# Patient Record
Sex: Female | Born: 1950
Health system: Southern US, Community
[De-identification: ages and names within clinical notes are randomized; demographics above are authoritative.]

## PROBLEM LIST (undated history)

## (undated) DIAGNOSIS — B019 Varicella without complication: Secondary | ICD-10-CM

## (undated) DIAGNOSIS — Z87440 Personal history of urinary (tract) infections: Secondary | ICD-10-CM

## (undated) DIAGNOSIS — N393 Stress incontinence (female) (male): Secondary | ICD-10-CM

## (undated) DIAGNOSIS — T7840XA Allergy, unspecified, initial encounter: Secondary | ICD-10-CM

## (undated) HISTORY — DX: Personal history of urinary (tract) infections: Z87.440

## (undated) HISTORY — DX: Stress incontinence (female) (male): N39.3

## (undated) HISTORY — DX: Varicella without complication: B01.9

## (undated) HISTORY — PX: BREAST EXCISIONAL BIOPSY: SUR124

## (undated) HISTORY — DX: Allergy, unspecified, initial encounter: T78.40XA

---

## 1970-03-07 HISTORY — PX: TONSILLECTOMY AND ADENOIDECTOMY: SUR1326

## 1981-03-07 HISTORY — PX: TUBAL LIGATION: SHX77

## 2014-07-08 LAB — HM MAMMOGRAPHY: HM Mammogram: NORMAL

## 2014-11-25 ENCOUNTER — Encounter: Payer: Self-pay | Admitting: Primary Care

## 2014-11-25 ENCOUNTER — Ambulatory Visit (INDEPENDENT_AMBULATORY_CARE_PROVIDER_SITE_OTHER): Payer: 59 | Admitting: Primary Care

## 2014-11-25 ENCOUNTER — Encounter (INDEPENDENT_AMBULATORY_CARE_PROVIDER_SITE_OTHER): Payer: Self-pay

## 2014-11-25 VITALS — BP 130/70 | HR 63 | Temp 98.9°F | Ht 61.75 in | Wt 141.4 lb

## 2014-11-25 DIAGNOSIS — N393 Stress incontinence (female) (male): Secondary | ICD-10-CM | POA: Diagnosis not present

## 2014-11-25 NOTE — Patient Instructions (Signed)
Schedule a Welcome to [facility] in January at your convenience.   Start exercising. You need 1 hour of exercise 5 days weekly.  It was a pleasure to meet you today! Please don't hesitate to call me with any questions. Welcome to [facility]!

## 2014-11-25 NOTE — Progress Notes (Signed)
Subjective:    Patient ID: Savannah Macias, female    DOB: 1951/01/30, 64 y.o.   MRN: [id number]  HPI  Savannah Macias is a year female who presents today to establish care and discuss the problems mentioned below. Will obtain old records. Her last physical was in May 2016 with Pap and mammogram.   1) Overweight: She endorses a fair diet. She limites fried foods and incorporates vegetables and lean meats. She will have about 5 glasses of red and white wine weekly.  Diet consists of: Breakfast: Toast (jam) and coffee and juice or cereal with coffee and juice. Lunch: Sometimes skips, [place] sandwich with fruit Dinner: Lean meats, vegetables, cheese and crackers. Limited starches and steaks. Desserts: Infrequently Snacks: Occasional snack with pretzles, crackers, fruit. Beverages: Water, coffee, juice, crystal light. No sodas, sweet tea.  Exercise: She does not regularly exercise but is active around the house.  2) Frequent UTI's: Last UTI was 3 years ago. 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 She was once evaluated by a urologist years ago. Will intermittently experience stress incontinence with sneezing or coughing.   Review of Systems  Constitutional: Negative for unexpected weight change.  HENT: Negative for rhinorrhea.   Respiratory: Negative for cough and shortness of breath.   Cardiovascular: Negative for chest pain.  Gastrointestinal: Negative for diarrhea and constipation.  Genitourinary: Negative for difficulty urinating.  Musculoskeletal: Negative for myalgias and arthralgias.  Skin: Negative for rash.       Seeing a dermatologist in [place] annually  Allergic/Immunologic: Positive for environmental allergies.  Neurological: Negative for dizziness, numbness and headaches.  Psychiatric/Behavioral:       Family history of anxiety, no concerns for anxiety and depression.      Past Medical History  Diagnosis Date  . Chicken pox   . Allergy   . History of recurrent UTI (urinary tract infection)   . Stress incontinence     Social History   Social History  . Marital Status: Married    Spouse Name: N/A  . Number of Children: N/A  . Years of Education: N/A   Occupational History  . Not on file.   Social History Main Topics  . Smoking status: Never Smoker   . Smokeless tobacco: Not on file  . Alcohol Use: 0.0 oz/week    0 Standard drinks or equivalent per week     Comment: social  . Drug Use: Not on file  . Sexual Activity: Not on file   Other Topics Concern  . Not on file   Social History Narrative   Married.   Moved to [place] from [place], [place].   Retired Pharmacist, hospital. Taught kindergarten and second grade.   Enjoys working in the yard, reading, crocheting, sewing, cooking.    Past Surgical History  Procedure Laterality Date  . Tubal ligation  1983  . Tonsillectomy and adenoidectomy  1972    Family History  Problem Relation Age of Onset  . Lung cancer Mother   . Hyperlipidemia Father   . Hypertension Father   . Breast cancer Sister     2012  . Hypertension Brother   . Hypertension Paternal Grandmother   . Hypertension Paternal Grandfather     No Known Allergies  No current outpatient prescriptions on file prior to visit.   No current facility-administered medications on file prior to visit.    BP 130/70 mmHg  Pulse 63  Temp(Src) 98.9 F (37.2 C) (Oral)  Ht 5' 1.75" (1.568 m)  Wt 141 lb 6.4 oz (64.139 kg)  BMI 26.09 kg/m2  SpO2 94%    Objective:   Physical Exam  Constitutional: She is oriented to person, place, and time. She appears well-nourished.  Cardiovascular: Normal rate and regular rhythm.   Pulmonary/Chest: Effort normal and breath sounds normal.  Neurological: She is alert and oriented to person, place, and time.  Skin: Skin is warm and dry.  Psychiatric: She has a normal mood and affect.            Assessment & Plan:

## 2014-11-25 NOTE — Assessment & Plan Note (Signed)
Intermittent with sneezing and coughing. Wears panty liners occasionally. Symptoms currently not bothersome. Discussed treatment options in the future. Will continue to monitor.

## 2014-11-25 NOTE — Progress Notes (Signed)
Pre visit review using our clinic review tool, if applicable. No additional management support is needed unless otherwise documented below in the visit note. 

## 2014-11-28 ENCOUNTER — Encounter: Payer: Self-pay | Admitting: Primary Care

## 2015-02-24 ENCOUNTER — Encounter: Payer: Self-pay | Admitting: Primary Care

## 2015-02-24 ENCOUNTER — Ambulatory Visit (INDEPENDENT_AMBULATORY_CARE_PROVIDER_SITE_OTHER): Payer: Medicare Other | Admitting: Primary Care

## 2015-02-24 VITALS — BP 148/82 | HR 79 | Temp 98.3°F | Ht 61.75 in | Wt 142.0 lb

## 2015-02-24 DIAGNOSIS — N390 Urinary tract infection, site not specified: Secondary | ICD-10-CM | POA: Diagnosis not present

## 2015-02-24 DIAGNOSIS — R300 Dysuria: Secondary | ICD-10-CM

## 2015-02-24 DIAGNOSIS — R319 Hematuria, unspecified: Secondary | ICD-10-CM | POA: Diagnosis not present

## 2015-02-24 LAB — POCT URINALYSIS DIPSTICK
Bilirubin, UA: NEGATIVE
Glucose, UA: NEGATIVE
KETONES UA: NEGATIVE
NITRITE UA: NEGATIVE
PH UA: 6.5
PROTEIN UA: NEGATIVE
Spec Grav, UA: 1.01
Urobilinogen, UA: NEGATIVE

## 2015-02-24 MED ORDER — SULFAMETHOXAZOLE-TRIMETHOPRIM 800-160 MG PO TABS: 1.0000 | ORAL_TABLET | Freq: Two times a day (BID) | ORAL | Status: DC

## 2015-02-24 NOTE — Progress Notes (Signed)
   Subjective:    Patient ID: Savannah Macias, female    DOB: 12-Sep-1950, 64 y.o.   MRN: [id number]  HPI  Savannah Macias is a 64 year old female who presents today with a chief complaint of urinary frequency. She also reports occasional dysuria and pelvic pressure. Her symptoms have been present for the past 3-4 days. She's been increasing her intake of water and cranberry juice, and taking ibuprofen. Denies fevers, vaginal symptoms, abdominal pain.   Review of Systems  Constitutional: Negative for fever and chills.  Gastrointestinal: Negative for abdominal pain.  Genitourinary: Positive for dysuria and frequency. Negative for urgency, flank pain and vaginal discharge.       Past Medical History  Diagnosis Date  . Chicken pox   . Allergy   . History of recurrent UTI (urinary tract infection)   . Stress incontinence     Social History   Social History  . Marital Status: Married    Spouse Name: N/A  . Number of Children: N/A  . Years of Education: N/A   Occupational History  . Not on file.   Social History Main Topics  . Smoking status: Never Smoker   . Smokeless tobacco: Not on file  . Alcohol Use: 0.0 oz/week    0 Standard drinks or equivalent per week     Comment: social  . Drug Use: Not on file  . Sexual Activity: Not on file   Other Topics Concern  . Not on file   Social History Narrative   Married.   Moved to [place] from [place], [place].   Retired Pharmacist, hospital. Taught kindergarten and second grade.   Enjoys working in the yard, reading, crocheting, sewing, cooking.    Past Surgical History  Procedure Laterality Date  . Tubal ligation  1983  . Tonsillectomy and adenoidectomy  1972    Family History  Problem Relation Age of Onset  . Lung cancer Mother   . Hyperlipidemia Father   . Hypertension Father   . Breast cancer Sister     2012  . Hypertension Brother   . Hypertension Paternal Grandmother   . Hypertension Paternal Grandfather     No Known  Allergies  No current outpatient prescriptions on file prior to visit.   No current facility-administered medications on file prior to visit.    BP 148/82 mmHg  Pulse 79  Temp(Src) 98.3 F (36.8 C) (Oral)  Ht 5' 1.75" (1.568 m)  Wt 142 lb (64.411 kg)  BMI 26.20 kg/m2  SpO2 97%    Objective:   Physical Exam  Constitutional: She appears well-nourished.  Cardiovascular: Normal rate and regular rhythm.   Pulmonary/Chest: Effort normal and breath sounds normal.  Abdominal: There is no tenderness. There is no CVA tenderness.  Skin: Skin is warm and dry.          Assessment & Plan:  Urinary Tract Infection:  Urinary frequency, pelvic pressure, dysuria x 3 days. Increased water and cranberry juice consumption. Overall symptoms are worse. UA: Positive for leuks and blood. Negative for nitrites. Culture sent. RX for bactrim DS course sent to pharmacy. Push fluids, rest. Return precautions provided.

## 2015-02-24 NOTE — Patient Instructions (Signed)
Start Bactrim DS antibiotics for urinary tract infection. Take 1 tablet by mouth twice daily for 5 days.  Continue to drink plenty of water daily.  Please call me if no improvement in symptoms in 3 days.  It was a pleasure to see you today!  Urinary Tract Infection Urinary tract infections (UTIs) can develop anywhere along your urinary tract. Your urinary tract is your body's drainage system for removing wastes and extra water. Your urinary tract includes two kidneys, two ureters, a bladder, and a urethra. Your kidneys are a pair of bean-shaped organs. Each kidney is about the size of your fist. They are located below your ribs, one on each side of your spine. CAUSES Infections are caused by microbes, which are microscopic organisms, including fungi, viruses, and bacteria. These organisms are so small that they can only be seen through a microscope. Bacteria are the microbes that most commonly cause UTIs. SYMPTOMS  Symptoms of UTIs may vary by age and gender of the patient and by the location of the infection. Symptoms in young women typically include a frequent and intense urge to urinate and a painful, burning feeling in the bladder or urethra during urination. Older women and men are more likely to be tired, shaky, and weak and have muscle aches and abdominal pain. A fever may mean the infection is in your kidneys. Other symptoms of a kidney infection include pain in your back or sides below the ribs, nausea, and vomiting. DIAGNOSIS To diagnose a UTI, your caregiver will ask you about your symptoms. Your caregiver will also ask you to provide a urine sample. The urine sample will be tested for bacteria and white blood cells. White blood cells are made by your body to help fight infection. TREATMENT  Typically, UTIs can be treated with medication. Because most UTIs are caused by a bacterial infection, they usually can be treated with the use of antibiotics. The choice of antibiotic and length of  treatment depend on your symptoms and the type of bacteria causing your infection. HOME CARE INSTRUCTIONS  If you were prescribed antibiotics, take them exactly as your caregiver instructs you. Finish the medication even if you feel better after you have only taken some of the medication.  Drink enough water and fluids to keep your urine clear or pale yellow.  Avoid caffeine, tea, and carbonated beverages. They tend to irritate your bladder.  Empty your bladder often. Avoid holding urine for long periods of time.  Empty your bladder before and after sexual intercourse.  After a bowel movement, women should cleanse from front to back. Use each tissue only once. SEEK MEDICAL CARE IF:   You have back pain.  You develop a fever.  Your symptoms do not begin to resolve within 3 days. SEEK IMMEDIATE MEDICAL CARE IF:   You have severe back pain or lower abdominal pain.  You develop chills.  You have nausea or vomiting.  You have continued burning or discomfort with urination. MAKE SURE YOU:   Understand these instructions.  Will watch your condition.  Will get help right away if you are not doing well or get worse.   This information is not intended to replace advice given to you by your health care provider. Make sure you discuss any questions you have with your health care provider.   Document Released: 12/01/2004 Document Revised: 11/12/2014 Document Reviewed: 04/01/2011 Elsevier Interactive Patient Education [employer].

## 2015-02-24 NOTE — Progress Notes (Signed)
Pre visit review using our clinic review tool, if applicable. No additional management support is needed unless otherwise documented below in the visit note. 

## 2015-02-26 LAB — URINE CULTURE

## 2015-03-30 ENCOUNTER — Ambulatory Visit (INDEPENDENT_AMBULATORY_CARE_PROVIDER_SITE_OTHER): Payer: Medicare Other | Admitting: Primary Care

## 2015-03-30 ENCOUNTER — Other Ambulatory Visit: Payer: Self-pay | Admitting: Primary Care

## 2015-03-30 ENCOUNTER — Encounter: Payer: Self-pay | Admitting: Primary Care

## 2015-03-30 VITALS — BP 124/84 | HR 83 | Temp 98.3°F | Ht 60.0 in | Wt 137.8 lb

## 2015-03-30 DIAGNOSIS — Z23 Encounter for immunization: Secondary | ICD-10-CM | POA: Diagnosis not present

## 2015-03-30 DIAGNOSIS — Z0000 Encounter for general adult medical examination without abnormal findings: Secondary | ICD-10-CM

## 2015-03-30 DIAGNOSIS — Z13220 Encounter for screening for lipoid disorders: Secondary | ICD-10-CM

## 2015-03-30 DIAGNOSIS — Z131 Encounter for screening for diabetes mellitus: Secondary | ICD-10-CM

## 2015-03-30 DIAGNOSIS — Z1159 Encounter for screening for other viral diseases: Secondary | ICD-10-CM | POA: Diagnosis not present

## 2015-03-30 LAB — COMPREHENSIVE METABOLIC PANEL
ALBUMIN: 4.4 g/dL (ref 3.5–5.2)
ALK PHOS: 57 U/L (ref 39–117)
ALT: 14 U/L (ref 0–35)
AST: 20 U/L (ref 0–37)
BUN: 13 mg/dL (ref 6–23)
CHLORIDE: 108 meq/L (ref 96–112)
CO2: 26 mEq/L (ref 19–32)
Calcium: 9.1 mg/dL (ref 8.4–10.5)
Creatinine, Ser: 0.71 mg/dL (ref 0.40–1.20)
GFR: 87.78 mL/min (ref >60.00)
Glucose, Bld: 94 mg/dL (ref 70–99)
POTASSIUM: 4.1 meq/L (ref 3.5–5.1)
Sodium: 140 mEq/L (ref 135–145)
TOTAL PROTEIN: 7.2 g/dL (ref 6.0–8.3)
Total Bilirubin: 0.6 mg/dL (ref 0.2–1.2)

## 2015-03-30 MED ORDER — ZOSTER VACCINE LIVE 19400 UNT/0.65ML ~~LOC~~ SOLR: 0.6500 mL | Freq: Once | SUBCUTANEOUS | Status: DC

## 2015-03-30 MED ORDER — ZOSTER VACCINE LIVE 19400 UNT/0.65ML ~~LOC~~ SOLR
0.6500 mL | Freq: Once | SUBCUTANEOUS | Status: DC
Start: 2015-03-30 — End: 2015-03-30

## 2015-03-30 NOTE — Progress Notes (Signed)
Pre visit review using our clinic review tool, if applicable. No additional management support is needed unless otherwise documented below in the visit note. 

## 2015-03-30 NOTE — Addendum Note (Signed)
Addended by: Jacqualin Combes on: 03/30/2015 11:44 AM   Modules accepted: Orders

## 2015-03-30 NOTE — Assessment & Plan Note (Signed)
Prevnar 13 and Td provided today. RX printed for Zostavax vaccination to be obtained in 1 month. Pneumovax due in 1 year. Pap, mammogram, bone density scan, colonoscopy up to date. Discussed the importance of a healthy diet and regular exercise in order for weight loss and to reduce risk of other medical diseases. She is to start exercising with a neighbor. CMP and Hep C pending. Not due for lipids.  I have personally reviewed and have noted: 1. The patient's medical and social history 2. Their use of alcohol, tobacco or illicit drugs 3. Their current medications and supplements 4. The patient's functional ability including ADL's, fall risks,  home safety risks and hearing or visual impairment. 5. Diet and physical activities 6. Evidence for depression or mood disorder  Follow up in 1 year for repeat MWV.

## 2015-03-30 NOTE — Progress Notes (Signed)
Patient ID: Savannah Macias, female   DOB: 17-May-1950, 65 y.o.   MRN: [id number]  HPI: Savannah Macias is a 65 year old female who presents today for her Welcome to Medicare Visit.  Past Medical History  Diagnosis Date  . Chicken pox   . Allergy   . History of recurrent UTI (urinary tract infection)   . Stress incontinence     Current Outpatient Prescriptions  Medication Sig Dispense Refill  . sulfamethoxazole-trimethoprim (BACTRIM DS,SEPTRA DS) 800-160 MG tablet Take 1 tablet by mouth 2 (two) times daily. 10 tablet 0   No current facility-administered medications for this visit.    No Known Allergies  Family History  Problem Relation Age of Onset  . Lung cancer Mother   . Hyperlipidemia Father   . Hypertension Father   . Breast cancer Sister     2012  . Hypertension Brother   . Hypertension Paternal Grandmother   . Hypertension Paternal Grandfather     Social History   Social History  . Marital Status: Married    Spouse Name: N/A  . Number of Children: N/A  . Years of Education: N/A   Occupational History  . Not on file.   Social History Main Topics  . Smoking status: Never Smoker   . Smokeless tobacco: Not on file  . Alcohol Use: 0.0 oz/week    0 Standard drinks or equivalent per week     Comment: social  . Drug Use: Not on file  . Sexual Activity: Not on file   Other Topics Concern  . Not on file   Social History Narrative   Married.   Moved to [place] from [place], [place].   Retired Pharmacist, hospital. Taught kindergarten and second grade.   Enjoys working in the yard, reading, crocheting, sewing, cooking.    Hospitiliaztions: None  Health Maintenance:     Flu: Has not completed since 2009.   Tetanus: Marena Chancy, believes in 2005.  Pneumovax: Never completed  Prevnar: Due today.  Zostavax: Has not completed, RX printed and will obtain in 1 month.  Bone Density: Completed in May 2016  Colonoscopy: Completed in 2009   Eye Doctor: April 2016  Dental Exam: Completed in  September 2016  Mammogram: Completed in May 2016, due 2018  Pap: Completed in May 2016, due 2019      Providers: Alma Friendly, PCP, Dr. Tessie Fass Dermatology, Dr. Gwendel Hanson, Optometrist, Dr. Maudie Mercury, Dentist   I have personally reviewed and have noted: 1. The patient's medical and social history 2. Their use of alcohol, tobacco or illicit drugs 3. Their current medications and supplements 4. The patient's functional ability including ADL's, fall risks, home safety risks  and hearing or visual impairment. 5. Diet and physical activities 6. Evidence for depression or mood disorder  Subjective:   Review of Systems:   Constitutional: Denies fever, malaise, fatigue, headache or abrupt weight changes.  HEENT: Denies eye pain, eye redness, ear pain, ringing in the ears, wax buildup, runny nose, nasal congestion, bloody nose, or sore throat. Respiratory: Denies difficulty breathing, shortness of breath, cough or sputum production.   Cardiovascular: Denies chest pain, chest tightness, palpitations or swelling in the hands or feet.  Gastrointestinal: Denies abdominal pain, bloating, constipation, diarrhea or blood in the stool.  GU: Denies urgency, frequency, pain with urination, burning sensation, blood in urine, odor or discharge. She does notice stress incontinence occasionally. She completes [place] exercises.  Musculoskeletal: Denies decrease in range of motion, difficulty with gait, muscle pain or joint pain and  swelling.  Skin: Denies redness, rashes, lesions or ulcercations.  Neurological: Denies dizziness, difficulty with memory, difficulty with speech or problems with balance and coordination.   No other specific complaints in a complete review of systems (except as listed in HPI above).  Objective:  PE:   There were no vitals taken for this visit. Wt Readings from Last 3 Encounters:  02/24/15 142 lb (64.411 kg)  11/25/14 141 lb 6.4 oz (64.139 kg)    General: Appears their stated age,  well developed, well nourished in NAD. Skin: Warm, dry and intact. No rashes, lesions or ulcerations noted. HEENT: Head: normal shape and size; Eyes: sclera white, no icterus, conjunctiva pink, PERRLA and EOMs intact; Ears: Tm's gray and intact, normal light reflex; Nose: mucosa pink and moist, septum midline; Throat/Mouth: Teeth present, mucosa pink and moist, no exudate, lesions or ulcerations noted.  Neck: Normal range of motion. Neck supple, trachea midline. No massses, lumps or thyromegaly present.  Cardiovascular: Normal rate and rhythm. S1,S2 noted.  No murmur, rubs or gallops noted. No JVD or BLE edema. No carotid bruits noted. Pulmonary/Chest: Normal effort and positive vesicular breath sounds. No respiratory distress. No wheezes, rales or ronchi noted.  Abdomen: Soft and nontender. Normal bowel sounds, no bruits noted. No distention or masses noted. Liver, spleen and kidneys non palpable. Musculoskeletal: Normal range of motion. No signs of joint swelling. No difficulty with gait.  Neurological: Alert and oriented. Cranial nerves II-XII intact. Coordination normal. +DTRs bilaterally. Psychiatric: Mood and affect normal. Behavior is normal. Judgment and thought content normal.   EKG: NSR, rate at 67. T-wave inversion noted at V1 and V2, otherwise no ST-elevation, depression, PAC, PVC's. Asymptomatic.  BMET No results found for: NA, K, CL, CO2, GLUCOSE, BUN, CREATININE, CALCIUM, GFRNONAA, GFRAA  Lipid Panel  No results found for: CHOL, TRIG, HDL, CHOLHDL, VLDL, LDLCALC  CBC No results found for: WBC, RBC, HGB, HCT, PLT, MCV, MCH, MCHC, RDW, LYMPHSABS, MONOABS, EOSABS, BASOSABS  Hgb A1C No results found for: HGBA1C    Assessment and Plan:   Medicare Annual Wellness Visit:  Diet: Endorses a heart healthy diet  Breakfast: Toast, juice, coffee, hot water with honey, fruit, bagel, english muffin, cereal. Eggs and bacon on the weekends. Lunch: Sandwich, left overs, soup Dinner:  Meat (chicken, fish, beef), vegetable, pizza, pasta, rice Desserts: Occasionally Snack: Cheese and crackers, potato chips, fruit Beverages: Water, coffee, hot water, juice, wine Physical activity: Active lifestyle, not currently exercising.  Depression/mood screen: Negative Hearing: Intact to whispered voice Visual acuity: Grossly normal, performs annual eye exam  ADLs: Capable Fall risk: None Home safety: Good Cognitive evaluation: Intact to orientation, naming, recall and repetition EOL planning: Does have healthcare power of attorney. Husband to make decisions if unable. Full code.  Preventative Medicine: Prevnar 13 and Td provided today. RX printed for Zostavax vaccination to be obtained in 1 month. Pneumovax due in 1 year. Pap, mammogram, bone density scan, colonoscopy up to date. Discussed the importance of a healthy diet and regular exercise in order for weight loss and to reduce risk of other medical diseases. She is to start exercising with a neighbor. CMP and Hep C pending. Not due for lipids.   Next appointment: Follow up in 1 year for repeat MWV.

## 2015-03-30 NOTE — Patient Instructions (Signed)
Complete lab work prior to leaving today. I will notify you of your results once received.   You were provided with a script for the shingles vaccination. Please wait to obtain until 30 days after your pneumonia vaccination.  You were provided with a tetanus and your first pneumonia vaccination today. You will be due in 1 year for repeat pneumonia vaccination.  Continue towards your healthy lifestyle. Start exercising as discussed.  Follow up in 1 year for repeat Medicare Wellness Visit or sooner if needed.  It was a pleasure to see you today!

## 2015-03-31 ENCOUNTER — Encounter: Payer: Self-pay | Admitting: *Deleted

## 2015-03-31 LAB — HEPATITIS C ANTIBODY: HCV Ab: NEGATIVE

## 2015-04-09 ENCOUNTER — Telehealth: Payer: Self-pay | Admitting: Primary Care

## 2015-04-09 NOTE — Telephone Encounter (Signed)
Called and notified patient of Kate's comments. Patient verbalized understanding.  

## 2015-04-09 NOTE — Telephone Encounter (Signed)
Pt would like to know why a cholesterol panel was not done for her medicare wellness exam.  cb number is [phone] Thank you

## 2015-04-09 NOTE — Telephone Encounter (Signed)
Unfortunately Medicare would not pay for a lipid panel. Typically they won't if you've hand a normal cholesterol level within 5 years.

## 2015-10-07 DIAGNOSIS — H33322 Round hole, left eye: Secondary | ICD-10-CM | POA: Diagnosis not present

## 2015-10-07 DIAGNOSIS — L821 Other seborrheic keratosis: Secondary | ICD-10-CM | POA: Diagnosis not present

## 2015-10-07 DIAGNOSIS — H353131 Nonexudative age-related macular degeneration, bilateral, early dry stage: Secondary | ICD-10-CM | POA: Diagnosis not present

## 2016-08-23 ENCOUNTER — Ambulatory Visit (INDEPENDENT_AMBULATORY_CARE_PROVIDER_SITE_OTHER): Payer: Medicare Other | Admitting: Primary Care

## 2016-08-23 VITALS — BP 142/92 | HR 72 | Temp 98.2°F | Ht 61.75 in | Wt 142.1 lb

## 2016-08-23 DIAGNOSIS — R300 Dysuria: Secondary | ICD-10-CM | POA: Diagnosis not present

## 2016-08-23 LAB — POC URINALSYSI DIPSTICK (AUTOMATED)
Bilirubin, UA: NEGATIVE
GLUCOSE UA: NEGATIVE
KETONES UA: NEGATIVE
Nitrite, UA: NEGATIVE
Protein, UA: NEGATIVE
RBC UA: NEGATIVE
SPEC GRAV UA: 1.015 (ref 1.010–1.025)
UROBILINOGEN UA: 0.2 U/dL
pH, UA: 6 (ref 5.0–8.0)

## 2016-08-23 MED ORDER — SULFAMETHOXAZOLE-TRIMETHOPRIM 800-160 MG PO TABS: 1.0000 | ORAL_TABLET | Freq: Two times a day (BID) | ORAL | 0 refills | Status: DC

## 2016-08-23 NOTE — Patient Instructions (Signed)
Start Bactrim DS (sulfamethoxazole/trimethoprim) tablets for urinary tract infection. Take 1 tablet by mouth twice daily for 5 days.  Continue to push intake of water.  You may try AZO for your symptoms of pelvic discomfort. This may be purchased over the counter.  It was a pleasure to see you today!

## 2016-08-23 NOTE — Addendum Note (Signed)
Addended by: Jacqualin Combes on: 08/23/2016 12:10 PM   Modules accepted: Orders

## 2016-08-23 NOTE — Progress Notes (Signed)
   Subjective:    Patient ID: Savannah Macias, female    DOB: 07/08/1950, 66 y.o.   MRN: [id number]  HPI  Savannah Macias is a 66 year old female with a history of stress incontinence and UTI who presents today with a chief complaint of pelvic pressure. Her pressure is located to the suprapubic region that has been intermittent for the past 5 days. She notices the discomfort mostly with urination. She denies hematuria, vaginal discharge, vaginal itching, fevers, flank pain. She's been drinking more water and started cranberry juice. She's not taken anything OTC.   Review of Systems  Constitutional: Negative for fever.  Gastrointestinal: Negative for abdominal pain and nausea.  Genitourinary: Positive for dysuria, frequency and pelvic pain. Negative for flank pain, vaginal bleeding and vaginal discharge.       Past Medical History:  Diagnosis Date  . Allergy   . Chicken pox   . History of recurrent UTI (urinary tract infection)   . Stress incontinence      Social History   Social History  . Marital status: Married    Spouse name: N/A  . Number of children: N/A  . Years of education: N/A   Occupational History  . Not on file.   Social History Main Topics  . Smoking status: Never Smoker  . Smokeless tobacco: Not on file  . Alcohol use 0.0 oz/week     Comment: social  . Drug use: Unknown  . Sexual activity: Not on file   Other Topics Concern  . Not on file   Social History Narrative   Married.   Moved to [place] from [place], [place].   Retired Pharmacist, hospital. Taught kindergarten and second grade.   Enjoys working in the yard, reading, crocheting, sewing, cooking.    Past Surgical History:  Procedure Laterality Date  . TONSILLECTOMY AND ADENOIDECTOMY  1972  . TUBAL LIGATION  1983    Family History  Problem Relation Age of Onset  . Lung cancer Mother   . Hyperlipidemia Father   . Hypertension Father   . Breast cancer Sister        2012  . Hypertension Brother   . Hypertension  Paternal Grandmother   . Hypertension Paternal Grandfather     No Known Allergies  No current outpatient prescriptions on file prior to visit.   No current facility-administered medications on file prior to visit.     BP (!) 142/92   Pulse 72   Temp 98.2 F (36.8 C) (Oral)   Ht 5' 1.75" (1.568 m)   Wt 142 lb 1.9 oz (64.5 kg)   SpO2 97%   BMI 26.20 kg/m    Objective:   Physical Exam  Constitutional: She appears well-nourished.  Neck: Neck supple.  Cardiovascular: Normal rate and regular rhythm.   Pulmonary/Chest: Effort normal and breath sounds normal.  Abdominal: Soft. There is no tenderness. There is no CVA tenderness.  Skin: Skin is warm and dry.          Assessment & Plan:  Acute Cystitis:  Dysuria, frequency, pelvic pressure x 5 days. Exam today unremarkable, does not appear sickly. UA: 3+ leuks, negative nitrites, negative blood. Culture sent. Rx for Bactrim course sent to pharmacy given symptoms plus presence of leuks. Fluids, rest, discussed AZO PRN.  Sheral Flow, NP

## 2016-08-25 LAB — URINE CULTURE

## 2016-09-20 ENCOUNTER — Other Ambulatory Visit: Payer: Self-pay | Admitting: Primary Care

## 2016-09-20 DIAGNOSIS — Z1231 Encounter for screening mammogram for malignant neoplasm of breast: Secondary | ICD-10-CM

## 2016-11-03 DIAGNOSIS — H2513 Age-related nuclear cataract, bilateral: Secondary | ICD-10-CM | POA: Diagnosis not present

## 2016-11-03 DIAGNOSIS — H353131 Nonexudative age-related macular degeneration, bilateral, early dry stage: Secondary | ICD-10-CM | POA: Diagnosis not present

## 2016-11-09 ENCOUNTER — Ambulatory Visit
Admission: RE | Admit: 2016-11-09 | Discharge: 2016-11-09 | Disposition: A | Discharge status: Home / Self Care | Payer: Medicare Other | Source: Ambulatory Visit | Attending: Primary Care | Admitting: Primary Care

## 2016-11-09 DIAGNOSIS — Z1231 Encounter for screening mammogram for malignant neoplasm of breast: Secondary | ICD-10-CM | POA: Diagnosis not present

## 2016-11-10 DIAGNOSIS — D229 Melanocytic nevi, unspecified: Secondary | ICD-10-CM | POA: Diagnosis not present

## 2016-11-10 DIAGNOSIS — L570 Actinic keratosis: Secondary | ICD-10-CM | POA: Diagnosis not present

## 2016-11-10 DIAGNOSIS — Z872 Personal history of diseases of the skin and subcutaneous tissue: Secondary | ICD-10-CM | POA: Diagnosis not present

## 2016-11-10 DIAGNOSIS — L578 Other skin changes due to chronic exposure to nonionizing radiation: Secondary | ICD-10-CM | POA: Diagnosis not present

## 2016-11-11 ENCOUNTER — Other Ambulatory Visit: Payer: Self-pay | Admitting: Primary Care

## 2016-11-11 DIAGNOSIS — Z13220 Encounter for screening for lipoid disorders: Secondary | ICD-10-CM

## 2016-11-15 ENCOUNTER — Ambulatory Visit (INDEPENDENT_AMBULATORY_CARE_PROVIDER_SITE_OTHER): Payer: Medicare Other

## 2016-11-15 VITALS — BP 138/78 | HR 73 | Temp 97.9°F | Ht 61.25 in | Wt 140.5 lb

## 2016-11-15 DIAGNOSIS — Z0000 Encounter for general adult medical examination without abnormal findings: Secondary | ICD-10-CM

## 2016-11-15 DIAGNOSIS — Z13220 Encounter for screening for lipoid disorders: Secondary | ICD-10-CM

## 2016-11-15 DIAGNOSIS — Z23 Encounter for immunization: Secondary | ICD-10-CM

## 2016-11-15 LAB — LIPID PANEL
CHOL/HDL RATIO: 3
CHOLESTEROL: 224 mg/dL — AB (ref 0–200)
HDL: 81.7 mg/dL (ref >39.00)
LDL CALC: 130 mg/dL — AB (ref 0–99)
NONHDL: 142.19
Triglycerides: 59 mg/dL (ref 0.0–149.0)
VLDL: 11.8 mg/dL (ref 0.0–40.0)

## 2016-11-15 LAB — COMPREHENSIVE METABOLIC PANEL
ALBUMIN: 4.2 g/dL (ref 3.5–5.2)
ALK PHOS: 46 U/L (ref 39–117)
ALT: 17 U/L (ref 0–35)
AST: 22 U/L (ref 0–37)
BUN: 16 mg/dL (ref 6–23)
CO2: 28 mEq/L (ref 19–32)
Calcium: 9.3 mg/dL (ref 8.4–10.5)
Chloride: 105 mEq/L (ref 96–112)
Creatinine, Ser: 0.67 mg/dL (ref 0.40–1.20)
GFR: 93.39 mL/min (ref >60.00)
Glucose, Bld: 88 mg/dL (ref 70–99)
POTASSIUM: 3.8 meq/L (ref 3.5–5.1)
SODIUM: 139 meq/L (ref 135–145)
Total Bilirubin: 0.6 mg/dL (ref 0.2–1.2)
Total Protein: 7.1 g/dL (ref 6.0–8.3)

## 2016-11-15 NOTE — Progress Notes (Signed)
Pre visit review using our clinic review tool, if applicable. No additional management support is needed unless otherwise documented below in the visit note. 

## 2016-11-15 NOTE — Patient Instructions (Signed)
Ms. Cullimore , Thank you for taking time to come for your Medicare Wellness Visit. I appreciate your ongoing commitment to your health goals. Please review the following plan we discussed and let me know if I can assist you in the future.   These are the goals we discussed: Goals    . Increase physical activity          Starting 11/15/2016, I will continue to either walk 3 miles or to exercise on stationary bike for 12 miles 4-5 days per week.        This is a list of the screening recommended for you and due dates:  Health Maintenance  Topic Date Due  . Flu Shot  06/04/2017*  . Colon Cancer Screening  03/07/2017  . Mammogram  11/10/2018  . Tetanus Vaccine  03/29/2025  . DEXA scan (bone density measurement)  Completed  .  Hepatitis C: One time screening is recommended by Center for Disease Control  (CDC) for  adults born from [birth year] through 1965.   Completed  . Pneumonia vaccines  Completed  *Topic was postponed. The date shown is not the original due date.   Preventive Care for Adults  A healthy lifestyle and preventive care can promote health and wellness. Preventive health guidelines for adults include the following key practices.  . A routine yearly physical is a good way to check with your health care provider about your health and preventive screening. It is a chance to share any concerns and updates on your health and to receive a thorough exam.  . Visit your dentist for a routine exam and preventive care every 6 months. Brush your teeth twice a day and floss once a day. Good oral hygiene prevents tooth decay and gum disease.  . The frequency of eye exams is based on your age, health, family medical history, use  of contact lenses, and other factors. Follow your health care provider's ecommendations for frequency of eye exams.  . Eat a healthy diet. Foods like vegetables, fruits, whole grains, low-fat dairy products, and lean protein foods contain the nutrients you need without  too many calories. Decrease your intake of foods high in solid fats, added sugars, and salt. Eat the right amount of calories for you. Get information about a proper diet from your health care provider, if necessary.  . Regular physical exercise is one of the most important things you can do for your health. Most adults should get at least 150 minutes of moderate-intensity exercise (any activity that increases your heart rate and causes you to sweat) each week. In addition, most adults need muscle-strengthening exercises on 2 or more days a week.  Silver Sneakers may be a benefit available to you. To determine eligibility, you may visit the website: www.silversneakers.com or contact program at (838) 373-3341 Mon-Fri between 8AM-8PM.   . Maintain a healthy weight. The body mass index (BMI) is a screening tool to identify possible weight problems. It provides an estimate of body fat based on height and weight. Your health care provider can find your BMI and can help you achieve or maintain a healthy weight.   For adults 20 years and older: ? A BMI below 18.5 is considered underweight. ? A BMI of 18.5 to 24.9 is normal. ? A BMI of 25 to 29.9 is considered overweight. ? A BMI of 30 and above is considered obese.   . Maintain normal blood lipids and cholesterol levels by exercising and minimizing your intake of saturated fat.  Eat a balanced diet with plenty of fruit and vegetables. Blood tests for lipids and cholesterol should begin at age 74 and be repeated every 5 years. If your lipid or cholesterol levels are high, you are over 50, or you are at high risk for heart disease, you may need your cholesterol levels checked more frequently. Ongoing high lipid and cholesterol levels should be treated with medicines if diet and exercise are not working.  . If you smoke, find out from your health care provider how to quit. If you do not use tobacco, please do not start.  . If you choose to drink alcohol,  please do not consume more than 2 drinks per day. One drink is considered to be 12 ounces (355 mL) of beer, 5 ounces (148 mL) of wine, or 1.5 ounces (44 mL) of liquor.  . If you are 72-51 years old, ask your health care provider if you should take aspirin to prevent strokes.  . Use sunscreen. Apply sunscreen liberally and repeatedly throughout the day. You should seek shade when your shadow is shorter than you. Protect yourself by wearing long sleeves, pants, a wide-brimmed hat, and sunglasses year round, whenever you are outdoors.  . Once a month, do a whole body skin exam, using a mirror to look at the skin on your back. Tell your health care provider of new moles, moles that have irregular borders, moles that are larger than a pencil eraser, or moles that have changed in shape or color.

## 2016-11-15 NOTE — Progress Notes (Signed)
Subjective:   Savannah Macias is a 66 y.o. female who presents for Medicare Annual (Subsequent) preventive examination.  Review of Systems:  N/A Cardiac Risk Factors include: advanced age (>69men, >18 women)     Objective:     Vitals: BP 138/78 (BP Location: Right Arm, Patient Position: Sitting, Cuff Size: Normal)   Pulse 73   Temp 97.9 F (36.6 C) (Oral)   Ht 5' 1.25" (1.556 m) Comment: no shoes  Wt 140 lb 8 oz (63.7 kg)   SpO2 97%   BMI 26.33 kg/m   Body mass index is 26.33 kg/m.   Tobacco History  Smoking Status  . Never Smoker  Smokeless Tobacco  . Never Used     Counseling given: No   Past Medical History:  Diagnosis Date  . Allergy   . Chicken pox   . History of recurrent UTI (urinary tract infection)   . Stress incontinence    Past Surgical History:  Procedure Laterality Date  . BREAST EXCISIONAL BIOPSY Left    benign  . TONSILLECTOMY AND ADENOIDECTOMY  1972  . TUBAL LIGATION  1983   Family History  Problem Relation Age of Onset  . Lung cancer Mother   . Hyperlipidemia Father   . Hypertension Father   . Breast cancer Sister        10  . Hypertension Brother   . Hypertension Paternal Grandmother   . Hypertension Paternal Grandfather    History  Sexual Activity  . Sexual activity: Yes  . Partners: Male    Outpatient Encounter Prescriptions as of 11/15/2016  Medication Sig  . Multiple Vitamins-Minerals (PRESERVISION AREDS 2 PO) Take 1 capsule by mouth daily.  . [DISCONTINUED] sulfamethoxazole-trimethoprim (BACTRIM DS,SEPTRA DS) 800-160 MG tablet Take 1 tablet by mouth 2 (two) times daily.   No facility-administered encounter medications on file as of 11/15/2016.     Activities of Daily Living In your present state of health, do you have any difficulty performing the following activities: 11/15/2016  Hearing? N  Vision? N  Difficulty concentrating or making decisions? N  Walking or climbing stairs? N  Dressing or bathing? N  Doing errands,  shopping? N  Preparing Food and eating ? N  Using the Toilet? N  In the past six months, have you accidently leaked urine? Y  Do you have problems with loss of bowel control? N  Managing your Medications? N  Managing your Finances? N  Housekeeping or managing your Housekeeping? N  Some recent data might be hidden    Patient Care Team: Pleas Koch, NP as PCP - General (Nurse Practitioner)    Assessment:     Hearing Screening   125Hz  250Hz  500Hz  1000Hz  2000Hz  3000Hz  4000Hz  6000Hz  8000Hz   Right ear:   40 40 40  40    Left ear:   40 40 40  40    Vision Screening Comments: Last vision exam in Sept 2018 at [facility] and Dietary recommendations Current Exercise Habits: Home exercise routine, Type of exercise: walking, Time (Minutes): 40, Frequency (Times/Week): 5, Weekly Exercise (Minutes/Week): 200, Intensity: Moderate, Exercise limited by: None identified  Goals    . Increase physical activity          Starting 11/15/2016, I will continue to either walk 3 miles or to exercise on stationary bike for 12 miles 4-5 days per week.       Fall Risk Fall Risk  11/15/2016 03/30/2015  Falls in the past  year? No No   Depression Screen PHQ 2/9 Scores 11/15/2016 08/23/2016 03/30/2015  PHQ - 2 Score 0 0 0  PHQ- 9 Score 0 - -     Cognitive Function MMSE - Mini Mental State Exam 11/15/2016  Orientation to time 5  Orientation to Place 5  Registration 3  Attention/ Calculation 0  Recall 3  Language- name 2 objects 0  Language- repeat 1  Language- follow 3 step command 3  Language- read & follow direction 0  Write a sentence 0  Copy design 0  Total score 20     PLEASE NOTE: A Mini-Cog screen was completed. Maximum score is 20. A value of 0 denotes this part of Folstein MMSE was not completed or the patient failed this part of the Mini-Cog screening.   Mini-Cog Screening Orientation to Time - Max 5 pts Orientation to Place - Max 5 pts Registration  - Max 3 pts Recall - Max 3 pts Language Repeat - Max 1 pts Language Follow 3 Step Command - Max 3 pts     Immunization History  Administered Date(s) Administered  . Pneumococcal Conjugate-13 03/30/2015  . Pneumococcal Polysaccharide-23 11/15/2016  . Td 03/30/2015   Screening Tests Health Maintenance  Topic Date Due  . INFLUENZA VACCINE  06/04/2017 (Originally 10/05/2016)  . COLONOSCOPY  03/07/2017  . MAMMOGRAM  11/10/2018  . TETANUS/TDAP  03/29/2025  . DEXA SCAN  Completed  . Hepatitis C Screening  Completed  . PNA vac Low Risk Adult  Completed      Plan:     I have personally reviewed and addressed the Medicare Annual Wellness questionnaire and have noted the following in the patient's chart:  A. Medical and social history B. Use of alcohol, tobacco or illicit drugs  C. Current medications and supplements D. Functional ability and status E.  Nutritional status F.  Physical activity G. Advance directives H. List of other physicians I.  Hospitalizations, surgeries, and ER visits in previous 12 months J.  [place] to include hearing, vision, cognitive, depression L. Referrals and appointments - none  In addition, I have reviewed and discussed with patient certain preventive protocols, quality metrics, and best practice recommendations. A written personalized care plan for preventive services as well as general preventive health recommendations were provided to patient.  See attached scanned questionnaire for additional information.   Signed,   Lindell Noe, MHA, BS, LPN Health Coach

## 2016-11-15 NOTE — Progress Notes (Signed)
PCP notes:   Health maintenance:  Flu vaccine - patient declined PPSV23 - administered Mammogram -patient provided screening date in Sept 2018  Abnormal screenings:   None  Patient concerns:   None  Nurse concerns:  None  Next PCP appt:   11/17/16 @ 1015

## 2016-11-16 NOTE — Progress Notes (Signed)
I reviewed health advisor's note, was available for consultation, and agree with documentation and plan.  

## 2016-11-17 ENCOUNTER — Ambulatory Visit (INDEPENDENT_AMBULATORY_CARE_PROVIDER_SITE_OTHER): Payer: Medicare Other | Admitting: Primary Care

## 2016-11-17 ENCOUNTER — Encounter: Payer: Self-pay | Admitting: Primary Care

## 2016-11-17 DIAGNOSIS — E785 Hyperlipidemia, unspecified: Secondary | ICD-10-CM

## 2016-11-17 NOTE — Patient Instructions (Signed)
Continue exercising. You should be getting 150 minutes of moderate intensity exercise weekly.  Continue to work on a healthy diet.  You are due for colonoscopy, pap smear, and bone density testing in 2019.  Follow up in 1 year for your annual exam or sooner if needed.  It was a pleasure to see you today!

## 2016-11-17 NOTE — Assessment & Plan Note (Signed)
TC of 224, HDL of 81, LDL of 130. Discussed to continue regular exercise and to increase vegetables, fruit, whole grains. Limit pork and red meat. Will continue to monitor, repeat in 1 year.

## 2016-11-17 NOTE — Progress Notes (Signed)
Subjective:    Patient ID: Savannah Macias, female    DOB: 1950-08-27, 66 y.o.   MRN: [id number]  HPI  Ms. Savannah Macias is a 66 year old female who presents today for [place] Part 2.  She saw our health advisor last week. She has no complaints today.  Influenza: Declines Tetanus: UTD Pneumonia: Prevnar and Pneumovax UTD  Diet: She endorses a healthy diet. Breakfast: Cereal, toast with jam/peanut butter Lunch: Sandwich Dinner: Chicken, seafood, pork, steak, pasta, salad, veggies Snacks: Cheese and crackers, fruit Desserts: Occasionally Beverages: Cranberry juice  Exercise: She walks most days weekly, also uses exercise bike Eye exam: Completed in August 2018 Colonoscopy: UTD, due in 2019 Dexa: Completed 2-3 years ago. Due in 2019. Pap Smear: Due in 2019, completed in 2016 Mammogram: Completed in September 2018    Review of Systems  Constitutional: Negative for unexpected weight change.  HENT: Negative for rhinorrhea.   Respiratory: Negative for cough and shortness of breath.   Cardiovascular: Negative for chest pain.  Gastrointestinal: Negative for constipation and diarrhea.  Genitourinary: Negative for difficulty urinating.  Musculoskeletal: Negative for arthralgias and myalgias.  Skin: Negative for rash.  Allergic/Immunologic: Negative for environmental allergies.  Neurological: Negative for dizziness, numbness and headaches.  Psychiatric/Behavioral:       Denies concerns for anxiety or depression       Past Medical History:  Diagnosis Date  . Allergy   . Chicken pox   . History of recurrent UTI (urinary tract infection)   . Stress incontinence      Social History   Social History  . Marital status: Married    Spouse name: N/A  . Number of children: N/A  . Years of education: N/A   Occupational History  . Not on file.   Social History Main Topics  . Smoking status: Never Smoker  . Smokeless tobacco: Never Used  . Alcohol use 3.0 oz/week    4 Glasses of wine, 1  Cans of beer per week     Comment: social  . Drug use: No  . Sexual activity: Yes    Partners: Male   Other Topics Concern  . Not on file   Social History Narrative   Married.   Moved to [place] from [place], [place].   Retired Pharmacist, hospital. Taught kindergarten and second grade.   Enjoys working in the yard, reading, crocheting, sewing, cooking.    Past Surgical History:  Procedure Laterality Date  . BREAST EXCISIONAL BIOPSY Left    benign  . TONSILLECTOMY AND ADENOIDECTOMY  1972  . TUBAL LIGATION  1983    Family History  Problem Relation Age of Onset  . Lung cancer Mother   . Hyperlipidemia Father   . Hypertension Father   . Breast cancer Sister        44  . Hypertension Brother   . Hypertension Paternal Grandmother   . Hypertension Paternal Grandfather     No Known Allergies  Current Outpatient Prescriptions on File Prior to Visit  Medication Sig Dispense Refill  . Multiple Vitamins-Minerals (PRESERVISION AREDS 2 PO) Take 1 capsule by mouth daily.     No current facility-administered medications on file prior to visit.     BP 130/76   Pulse 77   Temp 98.1 F (36.7 C) (Oral)   Ht 5' 1.25" (1.556 m)   Wt 140 lb (63.5 kg)   SpO2 96%   BMI 26.24 kg/m    Objective:   Physical Exam  Constitutional: She is oriented  to person, place, and time. She appears well-nourished.  HENT:  Right Ear: Tympanic membrane and ear canal normal.  Left Ear: Tympanic membrane and ear canal normal.  Nose: Nose normal.  Mouth/Throat: Oropharynx is clear and moist.  Eyes: Pupils are equal, round, and reactive to light. Conjunctivae and EOM are normal.  Neck: Neck supple. No thyromegaly present.  Cardiovascular: Normal rate and regular rhythm.   No murmur heard. Pulmonary/Chest: Effort normal and breath sounds normal. She has no rales.  Abdominal: Soft. Bowel sounds are normal. There is no tenderness.  Musculoskeletal: Normal range of motion.  Lymphadenopathy:    She has no  cervical adenopathy.  Neurological: She is alert and oriented to person, place, and time. She has normal reflexes. No cranial nerve deficit.  Skin: Skin is warm and dry. No rash noted.  Psychiatric: She has a normal mood and affect.          Assessment & Plan:

## 2016-11-21 ENCOUNTER — Other Ambulatory Visit: Payer: Self-pay | Admitting: *Deleted

## 2016-11-21 ENCOUNTER — Inpatient Hospital Stay
Admission: RE | Admit: 2016-11-21 | Discharge: 2016-11-21 | Disposition: A | Discharge status: Home / Self Care | Payer: Self-pay | Source: Ambulatory Visit | Attending: *Deleted | Admitting: *Deleted

## 2016-11-21 DIAGNOSIS — Z9289 Personal history of other medical treatment: Secondary | ICD-10-CM

## 2017-10-04 ENCOUNTER — Other Ambulatory Visit: Payer: Self-pay | Admitting: Primary Care

## 2017-10-04 DIAGNOSIS — Z1231 Encounter for screening mammogram for malignant neoplasm of breast: Secondary | ICD-10-CM

## 2017-11-10 ENCOUNTER — Ambulatory Visit
Admission: RE | Admit: 2017-11-10 | Discharge: 2017-11-10 | Disposition: A | Discharge status: Home / Self Care | Payer: Medicare Other | Source: Ambulatory Visit | Attending: Primary Care | Admitting: Primary Care

## 2017-11-10 ENCOUNTER — Telehealth: Payer: Self-pay | Admitting: Primary Care

## 2017-11-10 ENCOUNTER — Other Ambulatory Visit: Payer: Self-pay | Admitting: Primary Care

## 2017-11-10 DIAGNOSIS — N6320 Unspecified lump in the left breast, unspecified quadrant: Secondary | ICD-10-CM

## 2017-11-10 DIAGNOSIS — Z1231 Encounter for screening mammogram for malignant neoplasm of breast: Secondary | ICD-10-CM

## 2017-11-10 NOTE — Telephone Encounter (Signed)
Noted, orders approved.

## 2017-11-10 NOTE — Telephone Encounter (Signed)
Patient advised that orders are in and patient will be calling [facility] to schedule, also Samantha at [facility] is looking at these orders too per appt desk-Andrzej Scully V Daralyn Bert, RMA

## 2017-11-10 NOTE — Telephone Encounter (Signed)
Pt went to [facility] today for screening mammo. She has had changes and so [facility] would not do screening, will need orders for [place] and diagnostic. She is very anxious and would like to have this in asap. Please call with any updates. AWV set up 11/30/17.

## 2017-11-17 ENCOUNTER — Ambulatory Visit
Admission: RE | Admit: 2017-11-17 | Discharge: 2017-11-17 | Disposition: A | Discharge status: Home / Self Care | Payer: Medicare Other | Source: Ambulatory Visit | Attending: Primary Care | Admitting: Primary Care

## 2017-11-17 DIAGNOSIS — N6320 Unspecified lump in the left breast, unspecified quadrant: Secondary | ICD-10-CM | POA: Insufficient documentation

## 2017-11-17 DIAGNOSIS — Z1231 Encounter for screening mammogram for malignant neoplasm of breast: Secondary | ICD-10-CM | POA: Insufficient documentation

## 2017-11-17 DIAGNOSIS — R928 Other abnormal and inconclusive findings on diagnostic imaging of breast: Secondary | ICD-10-CM | POA: Diagnosis not present

## 2017-11-17 DIAGNOSIS — N6489 Other specified disorders of breast: Secondary | ICD-10-CM | POA: Diagnosis not present

## 2017-11-23 ENCOUNTER — Ambulatory Visit (INDEPENDENT_AMBULATORY_CARE_PROVIDER_SITE_OTHER): Payer: Medicare Other

## 2017-11-23 ENCOUNTER — Ambulatory Visit: Payer: Medicare Other

## 2017-11-23 VITALS — BP 130/80 | HR 82 | Temp 98.5°F | Ht 61.5 in | Wt 139.8 lb

## 2017-11-23 DIAGNOSIS — Z0000 Encounter for general adult medical examination without abnormal findings: Secondary | ICD-10-CM | POA: Diagnosis not present

## 2017-11-23 DIAGNOSIS — E785 Hyperlipidemia, unspecified: Secondary | ICD-10-CM

## 2017-11-23 LAB — COMPREHENSIVE METABOLIC PANEL
ALBUMIN: 4.5 g/dL (ref 3.5–5.2)
ALK PHOS: 55 U/L (ref 39–117)
ALT: 14 U/L (ref 0–35)
AST: 19 U/L (ref 0–37)
BUN: 19 mg/dL (ref 6–23)
CHLORIDE: 104 meq/L (ref 96–112)
CO2: 29 mEq/L (ref 19–32)
CREATININE: 0.8 mg/dL (ref 0.40–1.20)
Calcium: 9.7 mg/dL (ref 8.4–10.5)
GFR: 75.87 mL/min (ref >60.00)
Glucose, Bld: 99 mg/dL (ref 70–99)
Potassium: 4.8 mEq/L (ref 3.5–5.1)
Sodium: 138 mEq/L (ref 135–145)
TOTAL PROTEIN: 7.8 g/dL (ref 6.0–8.3)
Total Bilirubin: 0.6 mg/dL (ref 0.2–1.2)

## 2017-11-23 LAB — LIPID PANEL
CHOLESTEROL: 233 mg/dL — AB (ref 0–200)
HDL: 92 mg/dL (ref >39.00)
LDL CALC: 130 mg/dL — AB (ref 0–99)
NonHDL: 141.28
TRIGLYCERIDES: 55 mg/dL (ref 0.0–149.0)
Total CHOL/HDL Ratio: 3
VLDL: 11 mg/dL (ref 0.0–40.0)

## 2017-11-23 NOTE — Patient Instructions (Signed)
Ms. Baggett , Thank you for taking time to come for your Medicare Wellness Visit. I appreciate your ongoing commitment to your health goals. Please review the following plan we discussed and let me know if I can assist you in the future.   These are the goals we discussed: Goals    . Increase physical activity     Starting 11/23/2017, I will continue to either walk 3 miles or to exercise on stationary bike for 12 miles 3 days per week.        This is a list of the screening recommended for you and due dates:  Health Maintenance  Topic Date Due  . Colon Cancer Screening  03/06/2018*  . Flu Shot  10/06/2018*  . Mammogram  11/18/2019  . Tetanus Vaccine  03/29/2025  . DEXA scan (bone density measurement)  Completed  .  Hepatitis C: One time screening is recommended by Center for Disease Control  (CDC) for  adults born from 35 through 1966.   Completed  . Pneumonia vaccines  Completed  *Topic was postponed. The date shown is not the original due date.   Preventive Care for Adults  A healthy lifestyle and preventive care can promote health and wellness. Preventive health guidelines for adults include the following key practices.  . A routine yearly physical is a good way to check with your health care provider about your health and preventive screening. It is a chance to share any concerns and updates on your health and to receive a thorough exam.  . Visit your dentist for a routine exam and preventive care every 6 months. Brush your teeth twice a day and floss once a day. Good oral hygiene prevents tooth decay and gum disease.  . The frequency of eye exams is based on your age, health, family medical history, use  of contact lenses, and other factors. Follow your health care provider's recommendations for frequency of eye exams.  . Eat a healthy diet. Foods like vegetables, fruits, whole grains, low-fat dairy products, and lean protein foods contain the nutrients you need without too many  calories. Decrease your intake of foods high in solid fats, added sugars, and salt. Eat the right amount of calories for you. Get information about a proper diet from your health care provider, if necessary.  . Regular physical exercise is one of the most important things you can do for your health. Most adults should get at least 150 minutes of moderate-intensity exercise (any activity that increases your heart rate and causes you to sweat) each week. In addition, most adults need muscle-strengthening exercises on 2 or more days a week.  Silver Sneakers may be a benefit available to you. To determine eligibility, you may visit the website: www.silversneakers.com or contact program at 3322983551 Mon-Fri between 8AM-8PM.   . Maintain a healthy weight. The body mass index (BMI) is a screening tool to identify possible weight problems. It provides an estimate of body fat based on height and weight. Your health care provider can find your BMI and can help you achieve or maintain a healthy weight.   For adults 20 years and older: ? A BMI below 18.5 is considered underweight. ? A BMI of 18.5 to 24.9 is normal. ? A BMI of 25 to 29.9 is considered overweight. ? A BMI of 30 and above is considered obese.   . Maintain normal blood lipids and cholesterol levels by exercising and minimizing your intake of saturated fat. Eat a balanced diet with  plenty of fruit and vegetables. Blood tests for lipids and cholesterol should begin at age 81 and be repeated every 5 years. If your lipid or cholesterol levels are high, you are over 50, or you are at high risk for heart disease, you may need your cholesterol levels checked more frequently. Ongoing high lipid and cholesterol levels should be treated with medicines if diet and exercise are not working.  . If you smoke, find out from your health care provider how to quit. If you do not use tobacco, please do not start.  . If you choose to drink alcohol, please do  not consume more than 2 drinks per day. One drink is considered to be 12 ounces (355 mL) of beer, 5 ounces (148 mL) of wine, or 1.5 ounces (44 mL) of liquor.  . If you are 68-39 years old, ask your health care provider if you should take aspirin to prevent strokes.  . Use sunscreen. Apply sunscreen liberally and repeatedly throughout the day. You should seek shade when your shadow is shorter than you. Protect yourself by wearing long sleeves, pants, a wide-brimmed hat, and sunglasses year round, whenever you are outdoors.  . Once a month, do a whole body skin exam, using a mirror to look at the skin on your back. Tell your health care provider of new moles, moles that have irregular borders, moles that are larger than a pencil eraser, or moles that have changed in shape or color.

## 2017-11-26 NOTE — Progress Notes (Signed)
PCP notes:   Health maintenance:  Colon cancer screening - PCP follow-up needed Flu vaccine - pt declined  Abnormal screenings:   None  Patient concerns:   None  Nurse concerns:  None  Next PCP appt:   11/30/17 @ 1020

## 2017-11-26 NOTE — Progress Notes (Signed)
Subjective:   Savannah Macias is a 67 y.o. female who presents for Medicare Annual (Subsequent) preventive examination.  Review of Systems:  N/A Cardiac Risk Factors include: advanced age (>14men, >30 women)     Objective:     Vitals: BP 130/80 (BP Location: Right Arm, Patient Position: Sitting, Cuff Size: Normal)   Pulse 82   Temp 98.5 F (36.9 C) (Oral)   Ht 5' 1.5" (1.562 m) Comment: no shoes  Wt 139 lb 12 oz (63.4 kg)   SpO2 95%   BMI 25.98 kg/m   Body mass index is 25.98 kg/m.  Advanced Directives 11/23/2017 11/15/2016  Does Patient Have a Medical Advance Directive? Yes Yes  Type of Paramedic of [place];Living will [place];Living will  Does patient want to make changes to medical advance directive? No - Patient declined -  Copy of [place] in Chart? No - copy requested No - copy requested    Tobacco Social History   Tobacco Use  Smoking Status Never Smoker  Smokeless Tobacco Never Used     Counseling given: No   Clinical Intake:  Pre-visit preparation completed: Yes  Pain : No/denies pain Pain Score: 0-No pain     Nutritional Status: BMI 25 -29 Overweight Nutritional Risks: None Diabetes: No  How often do you need to have someone help you when you read instructions, pamphlets, or other written materials from your doctor or pharmacy?: 1 - Never What is the last grade level you completed in school?: Bachelors degree + plus some graduate courses  Interpreter Needed?: No  Comments: pt lives with spouse Information entered by :: LPinson, LPN  Past Medical History:  Diagnosis Date  . Allergy   . Chicken pox   . History of recurrent UTI (urinary tract infection)   . Stress incontinence    Past Surgical History:  Procedure Laterality Date  . BREAST EXCISIONAL BIOPSY Left    benign  . TONSILLECTOMY AND ADENOIDECTOMY  1972  . TUBAL LIGATION  1983   Family History  Problem Relation Age  of Onset  . Lung cancer Mother   . Hyperlipidemia Father   . Hypertension Father   . Breast cancer Sister        21  . Hypertension Brother   . Hypertension Paternal Grandmother   . Hypertension Paternal Grandfather    Social History   Socioeconomic History  . Marital status: Married    Spouse name: Not on file  . Number of children: Not on file  . Years of education: Not on file  . Highest education level: Not on file  Occupational History  . Not on file  Social Needs  . Financial resource strain: Not on file  . Food insecurity:    Worry: Not on file    Inability: Not on file  . Transportation needs:    Medical: Not on file    Non-medical: Not on file  Tobacco Use  . Smoking status: Never Smoker  . Smokeless tobacco: Never Used  Substance and Sexual Activity  . Alcohol use: Yes    Alcohol/week: 5.0 standard drinks    Types: 4 Glasses of wine, 1 Cans of beer per week    Comment: social  . Drug use: No  . Sexual activity: Yes    Partners: Male  Lifestyle  . Physical activity:    Days per week: Not on file    Minutes per session: Not on file  . Stress:  Not on file  Relationships  . Social connections:    Talks on phone: Not on file    Gets together: Not on file    Attends religious service: Not on file    Active member of club or organization: Not on file    Attends meetings of clubs or organizations: Not on file    Relationship status: Not on file  Other Topics Concern  . Not on file  Social History Narrative   Married.   Moved to [place] from [place], [place].   Retired Pharmacist, hospital. Taught kindergarten and second grade.   Enjoys working in the yard, reading, crocheting, sewing, cooking.    Outpatient Encounter Medications as of 11/23/2017  Medication Sig  . Multiple Vitamins-Minerals (PRESERVISION AREDS 2 PO) Take 1 capsule by mouth daily.   No facility-administered encounter medications on file as of 11/23/2017.     Activities of Daily Living In your  present state of health, do you have any difficulty performing the following activities: 11/23/2017  Hearing? N  Vision? N  Difficulty concentrating or making decisions? N  Walking or climbing stairs? N  Dressing or bathing? N  Doing errands, shopping? N  Preparing Food and eating ? N  Using the Toilet? N  In the past six months, have you accidently leaked urine? Y  Do you have problems with loss of bowel control? N  Managing your Medications? N  Managing your Finances? N  Housekeeping or managing your Housekeeping? N  Some recent data might be hidden    Patient Care Team: Pleas Koch, NP as PCP - General (Nurse Practitioner)    Assessment:   This is a routine wellness examination for Savannah Macias.   Hearing Screening   125Hz  250Hz  500Hz  1000Hz  2000Hz  3000Hz  4000Hz  6000Hz  8000Hz   Right ear:   40 40 40  40    Left ear:   40 40 40  40    Vision Screening Comments: Vision exam in Sept 2018 @ [facility]   Exercise Activities and Dietary recommendations Current Exercise Habits: Home exercise routine, Type of exercise: walking;treadmill, Time (Minutes): 60, Frequency (Times/Week): 3, Weekly Exercise (Minutes/Week): 180, Intensity: Moderate, Exercise limited by: None identified  Goals    . Increase physical activity     Starting 11/23/2017, I will continue to either walk 3 miles or to exercise on stationary bike for 12 miles 3 days per week.        Fall Risk Fall Risk  11/23/2017 11/15/2016 03/30/2015  Falls in the past year? No No No   Depression Screen PHQ 2/9 Scores 11/23/2017 11/15/2016 08/23/2016 03/30/2015  PHQ - 2 Score 0 0 0 0  PHQ- 9 Score 0 0 - -     Cognitive Function MMSE - Mini Mental State Exam 11/23/2017 11/15/2016  Orientation to time 5 5  Orientation to Place 5 5  Registration 3 3  Attention/ Calculation 0 0  Recall 3 3  Language- name 2 objects 0 0  Language- repeat 1 1  Language- follow 3 step command 3 3  Language- read & follow direction 0 0    Write a sentence 0 0  Copy design 0 0  Total score 20 20     PLEASE NOTE: A Mini-Cog screen was completed. Maximum score is 20. A value of 0 denotes this part of [place] MMSE was not completed or the patient failed this part of the Mini-Cog screening.   Mini-Cog Screening Orientation to Time - Max 5 pts Orientation  to Place - Max 5 pts Registration - Max 3 pts Recall - Max 3 pts Language Repeat - Max 1 pts Language Follow 3 Step Command - Max 3 pts     Immunization History  Administered Date(s) Administered  . Pneumococcal Conjugate-13 03/30/2015  . Pneumococcal Polysaccharide-23 11/15/2016  . Td 03/30/2015   Screening Tests Health Maintenance  Topic Date Due  . COLONOSCOPY  03/06/2018 (Originally 03/07/2017)  . INFLUENZA VACCINE  10/06/2018 (Originally 10/05/2017)  . MAMMOGRAM  11/18/2019  . TETANUS/TDAP  03/29/2025  . DEXA SCAN  Completed  . Hepatitis C Screening  Completed  . PNA vac Low Risk Adult  Completed      Plan:     I have personally reviewed, addressed, and noted the following in the patient's chart:  A. Medical and social history B. Use of alcohol, tobacco or illicit drugs  C. Current medications and supplements D. Functional ability and status E.  Nutritional status F.  Physical activity G. Advance directives H. List of other physicians I.  Hospitalizations, surgeries, and ER visits in previous 12 months J.  Hope to include hearing, vision, cognitive, depression L. Referrals and appointments - none  In addition, I have reviewed and discussed with patient certain preventive protocols, quality metrics, and best practice recommendations. A written personalized care plan for preventive services as well as general preventive health recommendations were provided to patient.  See attached scanned questionnaire for additional information.   Signed,   Lindell Noe, MHA, BS, LPN Health Coach

## 2017-11-29 NOTE — Progress Notes (Signed)
I reviewed health advisor's note, was available for consultation, and agree with documentation and plan.  

## 2017-11-30 ENCOUNTER — Ambulatory Visit (INDEPENDENT_AMBULATORY_CARE_PROVIDER_SITE_OTHER): Payer: Medicare Other | Admitting: Primary Care

## 2017-11-30 ENCOUNTER — Ambulatory Visit: Payer: Medicare Other | Admitting: Primary Care

## 2017-11-30 ENCOUNTER — Encounter: Payer: Self-pay | Admitting: Primary Care

## 2017-11-30 VITALS — BP 122/80 | HR 73 | Temp 98.2°F | Ht 61.5 in | Wt 141.5 lb

## 2017-11-30 DIAGNOSIS — N393 Stress incontinence (female) (male): Secondary | ICD-10-CM

## 2017-11-30 DIAGNOSIS — E2839 Other primary ovarian failure: Secondary | ICD-10-CM

## 2017-11-30 DIAGNOSIS — E785 Hyperlipidemia, unspecified: Secondary | ICD-10-CM

## 2017-11-30 NOTE — Patient Instructions (Signed)
Complete the cologuard kit for colon cancer screening once received.   Continue exercising. You should be getting 150 minutes of moderate intensity exercise weekly.  Ensure you are consuming 64 ounces of water daily.  Continue to eat a healthy diet.  Call the [facility] to schedule your bone density test.  We will see you next year for your annual exam or sooner if needed.  It was a pleasure to see you today!

## 2017-11-30 NOTE — Assessment & Plan Note (Signed)
LDL slightly above goal. HDL of 92. Grossly unchanged from last year.  Recommended to continue with regular exercise and healthy diet. Continue to monitor.

## 2017-11-30 NOTE — Progress Notes (Signed)
Subjective:    Patient ID: Savannah Macias, female    DOB: October 03, 1950, 67 y.o.   MRN: [id number]  HPI  Ms. Economos is a 67 year old female who presents today for [place] Part 2. She saw our health advisor last week.   Immunizations: -Tetanus: Completed in 2017 -Influenza: Declines -Pneumonia: Completed in 2018 and 2017 -Shingles: Declines   Diet: She endorses a healthy diet.  Breakfast: Cereal with fruit, toast, bagel with jam/honey/peanut juice Lunch: Left overs, lunch meat wrap, salad Dinner: Chicken, salmon, pork, eggs, vegetables, starch Snacks: Occasionally, chips, fruit  Desserts: Occasionally  Beverages: Juice, water, coffee, hot tea with honey, hot water with lemon, wine/beer.   Exercise: She is walking three days weekly, sometimes exercise bike Eye exam: Due in this Fall Dental exam: Completes every 9 months Colonoscopy: Completed in 2009, opts for Cologuard Dexa: Due  Mammogram: Completed in September 2019   Review of Systems  Respiratory: Negative for shortness of breath.   Cardiovascular: Negative for chest pain.  Gastrointestinal: Negative for constipation and diarrhea.  Genitourinary: Negative for difficulty urinating.       Intermittent stress incontinence   Skin: Negative for rash.  Neurological: Negative for dizziness and headaches.  Psychiatric/Behavioral: The patient is not nervous/anxious.        Past Medical History:  Diagnosis Date  . Allergy   . Chicken pox   . History of recurrent UTI (urinary tract infection)   . Stress incontinence      Social History   Socioeconomic History  . Marital status: Married    Spouse name: Not on file  . Number of children: Not on file  . Years of education: Not on file  . Highest education level: Not on file  Occupational History  . Not on file  Social Needs  . Financial resource strain: Not on file  . Food insecurity:    Worry: Not on file    Inability: Not on file  . Transportation needs:    Medical:  Not on file    Non-medical: Not on file  Tobacco Use  . Smoking status: Never Smoker  . Smokeless tobacco: Never Used  Substance and Sexual Activity  . Alcohol use: Yes    Alcohol/week: 5.0 standard drinks    Types: 4 Glasses of wine, 1 Cans of beer per week    Comment: social  . Drug use: No  . Sexual activity: Yes    Partners: Male  Lifestyle  . Physical activity:    Days per week: Not on file    Minutes per session: Not on file  . Stress: Not on file  Relationships  . Social connections:    Talks on phone: Not on file    Gets together: Not on file    Attends religious service: Not on file    Active member of club or organization: Not on file    Attends meetings of clubs or organizations: Not on file    Relationship status: Not on file  . Intimate partner violence:    Fear of current or ex partner: Not on file    Emotionally abused: Not on file    Physically abused: Not on file    Forced sexual activity: Not on file  Other Topics Concern  . Not on file  Social History Narrative   Married.   Moved to [place] from [place], [place].   Retired Pharmacist, hospital. Taught kindergarten and second grade.   Enjoys working in the yard, reading, crocheting,  sewing, cooking.    Past Surgical History:  Procedure Laterality Date  . BREAST EXCISIONAL BIOPSY Left    benign  . TONSILLECTOMY AND ADENOIDECTOMY  1972  . TUBAL LIGATION  1983    Family History  Problem Relation Age of Onset  . Lung cancer Mother   . Hyperlipidemia Father   . Hypertension Father   . Breast cancer Sister        28  . Hypertension Brother   . Hypertension Paternal Grandmother   . Hypertension Paternal Grandfather     No Known Allergies  Current Outpatient Medications on File Prior to Visit  Medication Sig Dispense Refill  . Multiple Vitamins-Minerals (PRESERVISION AREDS 2 PO) Take 1 capsule by mouth daily.     No current facility-administered medications on file prior to visit.     BP 122/80    Pulse 73   Temp 98.2 F (36.8 C) (Oral)   Ht 5' 1.5" (1.562 m)   Wt 141 lb 8 oz (64.2 kg)   SpO2 97%   BMI 26.30 kg/m    Objective:   Physical Exam  Constitutional: She is oriented to person, place, and time. She appears well-nourished.  HENT:  Mouth/Throat: No oropharyngeal exudate.  Eyes: Pupils are equal, round, and reactive to light. EOM are normal.  Neck: Neck supple. No thyromegaly present.  Cardiovascular: Normal rate and regular rhythm.  Respiratory: Effort normal and breath sounds normal.  GI: Soft. Bowel sounds are normal. There is no tenderness.  Musculoskeletal: Normal range of motion.  Neurological: She is alert and oriented to person, place, and time.  Skin: Skin is warm and dry.  Psychiatric: She has a normal mood and affect.           Assessment & Plan:  Health Maintenance:  Mammogram UTD. Declines influenza and shingles vaccinations.  Colon cancer screening due, Cologuard pending. Commended her on a health lifestyle, encouraged to continue.   Pleas Koch, NP

## 2017-11-30 NOTE — Assessment & Plan Note (Signed)
Intermittent, wearing panty liner.  Also does scheduled toileting. Discussed [place] exercises.

## 2018-01-02 DIAGNOSIS — Z1211 Encounter for screening for malignant neoplasm of colon: Secondary | ICD-10-CM | POA: Diagnosis not present

## 2018-01-02 DIAGNOSIS — Z1212 Encounter for screening for malignant neoplasm of rectum: Secondary | ICD-10-CM | POA: Diagnosis not present

## 2018-01-09 ENCOUNTER — Ambulatory Visit
Admission: RE | Admit: 2018-01-09 | Discharge: 2018-01-09 | Disposition: A | Discharge status: Home / Self Care | Payer: Medicare Other | Source: Ambulatory Visit | Attending: Primary Care | Admitting: Primary Care

## 2018-01-09 DIAGNOSIS — E2839 Other primary ovarian failure: Secondary | ICD-10-CM | POA: Diagnosis not present

## 2018-01-09 DIAGNOSIS — M810 Age-related osteoporosis without current pathological fracture: Secondary | ICD-10-CM | POA: Diagnosis not present

## 2018-01-09 DIAGNOSIS — Z780 Asymptomatic menopausal state: Secondary | ICD-10-CM | POA: Diagnosis not present

## 2018-01-09 DIAGNOSIS — M85851 Other specified disorders of bone density and structure, right thigh: Secondary | ICD-10-CM | POA: Diagnosis not present

## 2018-01-10 ENCOUNTER — Telehealth: Payer: Self-pay | Admitting: *Deleted

## 2018-01-10 NOTE — Telephone Encounter (Signed)
Message left for patient to return my call.  

## 2018-01-10 NOTE — Telephone Encounter (Signed)
-----   Message from Pleas Koch, NP sent at 01/09/2018  1:19 PM EST ----- Please notify patient:  You have osteoporosis which means your bones are weak. Your risk for fracture is higher than usual if you were to fall. I recommend treatment with mediation called alendronate (Fosamax). This is taken once weekly, on an empty stomach with water only. No food, other medications, or laying flat for 30 minutes. Is she agreeable?  Also needs to take 1200 mg of calcium and 800 units of vitamin D daily.  Let me know.

## 2018-01-19 NOTE — Telephone Encounter (Signed)
Per DPR, left detail message of Kate Clark's comments for patient to call back 

## 2018-11-30 ENCOUNTER — Ambulatory Visit: Payer: Medicare Other

## 2018-12-05 ENCOUNTER — Encounter: Payer: Medicare Other | Admitting: Primary Care

## 2018-12-10 ENCOUNTER — Other Ambulatory Visit: Payer: Self-pay

## 2018-12-10 DIAGNOSIS — Z20822 Contact with and (suspected) exposure to covid-19: Secondary | ICD-10-CM

## 2018-12-12 LAB — NOVEL CORONAVIRUS, NAA: SARS-CoV-2, NAA: DETECTED — AB

## 2019-01-01 ENCOUNTER — Other Ambulatory Visit: Payer: Medicare Other

## 2019-01-03 ENCOUNTER — Ambulatory Visit: Payer: Medicare Other

## 2019-01-08 ENCOUNTER — Encounter: Payer: Medicare Other | Admitting: Primary Care

## 2019-01-25 ENCOUNTER — Other Ambulatory Visit: Payer: Self-pay

## 2019-01-27 ENCOUNTER — Other Ambulatory Visit: Payer: Self-pay | Admitting: Primary Care

## 2019-01-27 DIAGNOSIS — E785 Hyperlipidemia, unspecified: Secondary | ICD-10-CM

## 2019-02-05 ENCOUNTER — Other Ambulatory Visit: Payer: Medicare Other

## 2019-02-05 ENCOUNTER — Ambulatory Visit: Payer: Medicare Other

## 2019-02-06 ENCOUNTER — Other Ambulatory Visit: Payer: Self-pay

## 2019-02-06 ENCOUNTER — Other Ambulatory Visit (INDEPENDENT_AMBULATORY_CARE_PROVIDER_SITE_OTHER): Payer: Medicare Other

## 2019-02-06 ENCOUNTER — Ambulatory Visit (INDEPENDENT_AMBULATORY_CARE_PROVIDER_SITE_OTHER): Payer: Medicare Other

## 2019-02-06 VITALS — Wt 142.0 lb

## 2019-02-06 DIAGNOSIS — E785 Hyperlipidemia, unspecified: Secondary | ICD-10-CM | POA: Diagnosis not present

## 2019-02-06 DIAGNOSIS — Z0000 Encounter for general adult medical examination without abnormal findings: Secondary | ICD-10-CM

## 2019-02-06 LAB — CBC
HCT: 39.3 % (ref 36.0–46.0)
Hemoglobin: 13.2 g/dL (ref 12.0–15.0)
MCHC: 33.7 g/dL (ref 30.0–36.0)
MCV: 90.7 fl (ref 78.0–100.0)
Platelets: 278 10*3/uL (ref 150.0–400.0)
RBC: 4.33 Mil/uL (ref 3.87–5.11)
RDW: 13.2 % (ref 11.5–15.5)
WBC: 5.4 10*3/uL (ref 4.0–10.5)

## 2019-02-06 LAB — LIPID PANEL
Cholesterol: 257 mg/dL — ABNORMAL HIGH (ref 0–200)
HDL: 82.9 mg/dL (ref >39.00)
LDL Cholesterol: 160 mg/dL — ABNORMAL HIGH (ref 0–99)
NonHDL: 173.91
Total CHOL/HDL Ratio: 3
Triglycerides: 69 mg/dL (ref 0.0–149.0)
VLDL: 13.8 mg/dL (ref 0.0–40.0)

## 2019-02-06 LAB — COMPREHENSIVE METABOLIC PANEL
ALT: 12 U/L (ref 0–35)
AST: 17 U/L (ref 0–37)
Albumin: 4.4 g/dL (ref 3.5–5.2)
Alkaline Phosphatase: 63 U/L (ref 39–117)
BUN: 11 mg/dL (ref 6–23)
CO2: 29 mEq/L (ref 19–32)
Calcium: 9.6 mg/dL (ref 8.4–10.5)
Chloride: 105 mEq/L (ref 96–112)
Creatinine, Ser: 0.76 mg/dL (ref 0.40–1.20)
GFR: 75.47 mL/min (ref >60.00)
Glucose, Bld: 97 mg/dL (ref 70–99)
Potassium: 4.1 mEq/L (ref 3.5–5.1)
Sodium: 139 mEq/L (ref 135–145)
Total Bilirubin: 0.6 mg/dL (ref 0.2–1.2)
Total Protein: 7.4 g/dL (ref 6.0–8.3)

## 2019-02-06 NOTE — Progress Notes (Signed)
Subjective:   Savannah Macias is a 68 y.o. female who presents for Medicare Annual (Subsequent) preventive examination.  Review of Systems: N/A   This visit is being conducted through telemedicine via telephone at the nurse health advisor's home address due to the COVID-19 pandemic. This patient has given me verbal consent via doximity to conduct this visit, patient states they are participating from their home address. Patient and myself are on the telephone call. There is no referral for this visit. Some vital signs may be absent or patient reported.    Patient identification: identified by name, DOB, and current address   Cardiac Risk Factors include: advanced age (>92men, >52 women);dyslipidemia     Objective:     Vitals: Wt 142 lb (64.4 kg)   BMI 26.40 kg/m   Body mass index is 26.4 kg/m.  Advanced Directives 02/06/2019 11/23/2017 11/15/2016  Does Patient Have a Medical Advance Directive? Yes Yes Yes  Type of Paramedic of [place];Living will [place];Living will [place];Living will  Does patient want to make changes to medical advance directive? - No - Patient declined -  Copy of [place] in Chart? Yes - validated most recent copy scanned in chart (See row information) No - copy requested No - copy requested    Tobacco Social History   Tobacco Use  Smoking Status Never Smoker  Smokeless Tobacco Never Used     Counseling given: Not Answered   Clinical Intake:  Pre-visit preparation completed: Yes  Pain : No/denies pain     Nutritional Risks: None Diabetes: No  How often do you need to have someone help you when you read instructions, pamphlets, or other written materials from your doctor or pharmacy?: 1 - Never What is the last grade level you completed in school?: college graduate  Interpreter Needed?: No  Information entered by :: CJohnson, LPN  Past Medical History:   Diagnosis Date  . Allergy   . Chicken pox   . History of recurrent UTI (urinary tract infection)   . Stress incontinence    Past Surgical History:  Procedure Laterality Date  . BREAST EXCISIONAL BIOPSY Left    benign  . TONSILLECTOMY AND ADENOIDECTOMY  1972  . TUBAL LIGATION  1983   Family History  Problem Relation Age of Onset  . Lung cancer Mother   . Hyperlipidemia Father   . Hypertension Father   . Breast cancer Sister        35  . Hypertension Brother   . Hypertension Paternal Grandmother   . Hypertension Paternal Grandfather    Social History   Socioeconomic History  . Marital status: Married    Spouse name: Not on file  . Number of children: Not on file  . Years of education: Not on file  . Highest education level: Not on file  Occupational History  . Not on file  Social Needs  . Financial resource strain: Not hard at all  . Food insecurity    Worry: Never true    Inability: Never true  . Transportation needs    Medical: No    Non-medical: No  Tobacco Use  . Smoking status: Never Smoker  . Smokeless tobacco: Never Used  Substance and Sexual Activity  . Alcohol use: Yes    Alcohol/week: 5.0 standard drinks    Types: 4 Glasses of wine, 1 Cans of beer per week    Comment: social  . Drug use: No  .  Sexual activity: Yes    Partners: Male  Lifestyle  . Physical activity    Days per week: 0 days    Minutes per session: 0 min  . Stress: Not at all  Relationships  . Social Herbalist on phone: Not on file    Gets together: Not on file    Attends religious service: Not on file    Active member of club or organization: Not on file    Attends meetings of clubs or organizations: Not on file    Relationship status: Not on file  Other Topics Concern  . Not on file  Social History Narrative   Married.   Moved to [place] from [place], [place].   Retired Pharmacist, hospital. Taught kindergarten and second grade.   Enjoys working in the yard, reading,  crocheting, sewing, cooking.    Outpatient Encounter Medications as of 02/06/2019  Medication Sig  . Multiple Vitamins-Minerals (PRESERVISION AREDS 2 PO) Take 1 capsule by mouth daily.   No facility-administered encounter medications on file as of 02/06/2019.     Activities of Daily Living In your present state of health, do you have any difficulty performing the following activities: 02/06/2019  Hearing? N  Vision? N  Difficulty concentrating or making decisions? N  Walking or climbing stairs? N  Dressing or bathing? N  Doing errands, shopping? N  Preparing Food and eating ? N  Using the Toilet? N  In the past six months, have you accidently leaked urine? Y  Comment wears pantyliner  Do you have problems with loss of bowel control? N  Managing your Medications? N  Managing your Finances? N  Housekeeping or managing your Housekeeping? N  Some recent data might be hidden    Patient Care Team: Pleas Koch, NP as PCP - General (Nurse Practitioner)    Assessment:   This is a routine wellness examination for Savannah Macias.  Exercise Activities and Dietary recommendations Current Exercise Habits: Home exercise routine, Type of exercise: walking, Time (Minutes): 60, Frequency (Times/Week): 7, Weekly Exercise (Minutes/Week): 420, Intensity: Mild, Exercise limited by: None identified  Goals    . Increase physical activity     Starting 11/23/2017, I will continue to either walk 3 miles or to exercise on stationary bike for 12 miles 3 days per week.     . Patient Stated     02/06/2019, I will try to exercise more daily and work on losing some weight.        Fall Risk Fall Risk  02/06/2019 11/23/2017 11/15/2016 03/30/2015  Falls in the past year? 0 No No No  Number falls in past yr: 0 - - -  Injury with Fall? 0 - - -  Follow up Falls evaluation completed;Falls prevention discussed - - -   Is the patient's home free of loose throw rugs in walkways, pet beds, electrical cords, etc?    yes      Grab bars in the bathroom? no      Handrails on the stairs?   yes      Adequate lighting?   yes  Timed Get Up and Go performed: N/A  Depression Screen PHQ 2/9 Scores 02/06/2019 11/23/2017 11/15/2016 08/23/2016  PHQ - 2 Score 0 0 0 0  PHQ- 9 Score 0 0 0 -     Cognitive Function MMSE - Mini Mental State Exam 02/06/2019 11/23/2017 11/15/2016  Orientation to time 5 5 5   Orientation to Place 5 5 5   Registration 3 3  3  Attention/ Calculation 5 0 0  Recall 3 3 3   Language- name 2 objects - 0 0  Language- repeat 1 1 1   Language- follow 3 step command - 3 3  Language- read & follow direction - 0 0  Write a sentence - 0 0  Copy design - 0 0  Total score - 20 20  Mini Cog  Mini-Cog screen was completed. Maximum score is 22. A value of 0 denotes this part of the MMSE was not completed or the patient failed this part of the Mini-Cog screening.       Immunization History  Administered Date(s) Administered  . Pneumococcal Conjugate-13 03/30/2015  . Pneumococcal Polysaccharide-23 11/15/2016  . Td 03/30/2015    Qualifies for Shingles Vaccine? yes  Screening Tests Health Maintenance  Topic Date Due  . COLONOSCOPY  03/07/2017  . INFLUENZA VACCINE  06/05/2019 (Originally 10/06/2018)  . MAMMOGRAM  11/18/2019  . TETANUS/TDAP  03/29/2025  . DEXA SCAN  Completed  . Hepatitis C Screening  Completed  . PNA vac Low Risk Adult  Completed    Cancer Screenings: Lung: Low Dose CT Chest recommended if Age 30-80 years, 30 pack-year currently smoking OR have quit w/in 15years. Patient does not qualify. Breast:  Up to date on Mammogram? Yes, completed 11/17/2017   Up to date of Bone Density/Dexa? Yes, completed 01/09/2018 Colorectal: Patient states she completed Cologuard last year.  Additional Screenings:  Hepatitis C Screening: 03/30/2015     Plan:    Patient will try to exercise more and work on losing some weight.   I have personally reviewed and noted the following in the patient's  chart:   . Medical and social history . Use of alcohol, tobacco or illicit drugs  . Current medications and supplements . Functional ability and status . Nutritional status . Physical activity . Advanced directives . List of other physicians . Hospitalizations, surgeries, and ER visits in previous 12 months . Vitals . Screenings to include cognitive, depression, and falls . Referrals and appointments  In addition, I have reviewed and discussed with patient certain preventive protocols, quality metrics, and best practice recommendations. A written personalized care plan for preventive services as well as general preventive health recommendations were provided to patient.     Andrez Grime, LPN  [id number]

## 2019-02-06 NOTE — Patient Instructions (Signed)
Savannah Macias , Thank you for taking time to come for your Medicare Wellness Visit. I appreciate your ongoing commitment to your health goals. Please review the following plan we discussed and let me know if I can assist you in the future.   Screening recommendations/referrals: Colonoscopy: Patient states she completed Cologuard last year Mammogram: Up to date, completed 11/17/2017 Bone Density: Up to date, completed 01/09/2018 Recommended yearly ophthalmology/optometry visit for glaucoma screening and checkup Recommended yearly dental visit for hygiene and checkup  Vaccinations: Influenza vaccine: declined Pneumococcal vaccine: Completed series Tdap vaccine: Up to date, completed 03/30/2015 Shingles vaccine: declined    Advanced directives: copy in chart  Conditions/risks identified: hyperlipidemia  Next appointment: 02/13/2019 @ 8 am    Preventive Care 69 Years and Older, Female Preventive care refers to lifestyle choices and visits with your health care provider that can promote health and wellness. What does preventive care include?  A yearly physical exam. This is also called an annual well check.  Dental exams once or twice a year.  Routine eye exams. Ask your health care provider how often you should have your eyes checked.  Personal lifestyle choices, including:  Daily care of your teeth and gums.  Regular physical activity.  Eating a healthy diet.  Avoiding tobacco and drug use.  Limiting alcohol use.  Practicing safe sex.  Taking low-dose aspirin every day.  Taking vitamin and mineral supplements as recommended by your health care provider. What happens during an annual well check? The services and screenings done by your health care provider during your annual well check will depend on your age, overall health, lifestyle risk factors, and family history of disease. Counseling  Your health care provider may ask you questions about your:  Alcohol use.  Tobacco  use.  Drug use.  Emotional well-being.  Home and relationship well-being.  Sexual activity.  Eating habits.  History of falls.  Memory and ability to understand (cognition).  Work and work Statistician.  Reproductive health. Screening  You may have the following tests or measurements:  Height, weight, and BMI.  Blood pressure.  Lipid and cholesterol levels. These may be checked every 5 years, or more frequently if you are over 69 years old.  Skin check.  Lung cancer screening. You may have this screening every year starting at age 15 if you have a 30-pack-year history of smoking and currently smoke or have quit within the past 15 years.  Fecal occult blood test (FOBT) of the stool. You may have this test every year starting at age 25.  Flexible sigmoidoscopy or colonoscopy. You may have a sigmoidoscopy every 5 years or a colonoscopy every 10 years starting at age 69.  Hepatitis C blood test.  Hepatitis B blood test.  Sexually transmitted disease (STD) testing.  Diabetes screening. This is done by checking your blood sugar (glucose) after you have not eaten for a while (fasting). You may have this done every 1-3 years.  Bone density scan. This is done to screen for osteoporosis. You may have this done starting at age 62.  Mammogram. This may be done every 1-2 years. Talk to your health care provider about how often you should have regular mammograms. Talk with your health care provider about your test results, treatment options, and if necessary, the need for more tests. Vaccines  Your health care provider may recommend certain vaccines, such as:  Influenza vaccine. This is recommended every year.  Tetanus, diphtheria, and acellular pertussis (Tdap, Td) vaccine. You  may need a Td booster every 10 years.  Zoster vaccine. You may need this after age 79.  Pneumococcal 13-valent conjugate (PCV13) vaccine. One dose is recommended after age 76.  Pneumococcal  polysaccharide (PPSV23) vaccine. One dose is recommended after age 32. Talk to your health care provider about which screenings and vaccines you need and how often you need them. This information is not intended to replace advice given to you by your health care provider. Make sure you discuss any questions you have with your health care provider. Document Released: 03/20/2015 Document Revised: 11/11/2015 Document Reviewed: 12/23/2014 Elsevier Interactive Patient Education  2017 [place] Prevention in the Home Falls can cause injuries. They can happen to people of all ages. There are many things you can do to make your home safe and to help prevent falls. What can I do on the outside of my home?  Regularly fix the edges of walkways and driveways and fix any cracks.  Remove anything that might make you trip as you walk through a door, such as a raised step or threshold.  Trim any bushes or trees on the path to your home.  Use bright outdoor lighting.  Clear any walking paths of anything that might make someone trip, such as rocks or tools.  Regularly check to see if handrails are loose or broken. Make sure that both sides of any steps have handrails.  Any raised decks and porches should have guardrails on the edges.  Have any leaves, snow, or ice cleared regularly.  Use sand or salt on walking paths during winter.  Clean up any spills in your garage right away. This includes oil or grease spills. What can I do in the bathroom?  Use night lights.  Install grab bars by the toilet and in the tub and shower. Do not use towel bars as grab bars.  Use non-skid mats or decals in the tub or shower.  If you need to sit down in the shower, use a plastic, non-slip stool.  Keep the floor dry. Clean up any water that spills on the floor as soon as it happens.  Remove soap buildup in the tub or shower regularly.  Attach bath mats securely with double-sided non-slip rug tape.   Do not have throw rugs and other things on the floor that can make you trip. What can I do in the bedroom?  Use night lights.  Make sure that you have a light by your bed that is easy to reach.  Do not use any sheets or blankets that are too big for your bed. They should not hang down onto the floor.  Have a firm chair that has side arms. You can use this for support while you get dressed.  Do not have throw rugs and other things on the floor that can make you trip. What can I do in the kitchen?  Clean up any spills right away.  Avoid walking on wet floors.  Keep items that you use a lot in easy-to-reach places.  If you need to reach something above you, use a strong step stool that has a grab bar.  Keep electrical cords out of the way.  Do not use floor polish or wax that makes floors slippery. If you must use wax, use non-skid floor wax.  Do not have throw rugs and other things on the floor that can make you trip. What can I do with my stairs?  Do not leave any items  on the stairs.  Make sure that there are handrails on both sides of the stairs and use them. Fix handrails that are broken or loose. Make sure that handrails are as long as the stairways.  Check any carpeting to make sure that it is firmly attached to the stairs. Fix any carpet that is loose or worn.  Avoid having throw rugs at the top or bottom of the stairs. If you do have throw rugs, attach them to the floor with carpet tape.  Make sure that you have a light switch at the top of the stairs and the bottom of the stairs. If you do not have them, ask someone to add them for you. What else can I do to help prevent falls?  Wear shoes that:  Do not have high heels.  Have rubber bottoms.  Are comfortable and fit you well.  Are closed at the toe. Do not wear sandals.  If you use a stepladder:  Make sure that it is fully opened. Do not climb a closed stepladder.  Make sure that both sides of the  stepladder are locked into place.  Ask someone to hold it for you, if possible.  Clearly mark and make sure that you can see:  Any grab bars or handrails.  First and last steps.  Where the edge of each step is.  Use tools that help you move around (mobility aids) if they are needed. These include:  Canes.  Walkers.  Scooters.  Crutches.  Turn on the lights when you go into a dark area. Replace any light bulbs as soon as they burn out.  Set up your furniture so you have a clear path. Avoid moving your furniture around.  If any of your floors are uneven, fix them.  If there are any pets around you, be aware of where they are.  Review your medicines with your doctor. Some medicines can make you feel dizzy. This can increase your chance of falling. Ask your doctor what other things that you can do to help prevent falls. This information is not intended to replace advice given to you by your health care provider. Make sure you discuss any questions you have with your health care provider. Document Released: 12/18/2008 Document Revised: 07/30/2015 Document Reviewed: 03/28/2014 Elsevier Interactive Patient Education  2017 [employer].

## 2019-02-06 NOTE — Progress Notes (Signed)
PCP notes:  Health Maintenance: Declined flu vaccine and shingrix  Patient states she completed the Cologuard last year- did not see results in chart   Abnormal Screenings: none   Patient concerns: none   Nurse concerns: none   Next PCP appt.: 02/13/2019 @ 8 am

## 2019-02-13 ENCOUNTER — Other Ambulatory Visit: Payer: Self-pay

## 2019-02-13 ENCOUNTER — Ambulatory Visit (INDEPENDENT_AMBULATORY_CARE_PROVIDER_SITE_OTHER): Payer: Medicare Other | Admitting: Primary Care

## 2019-02-13 ENCOUNTER — Encounter: Payer: Self-pay | Admitting: Primary Care

## 2019-02-13 VITALS — BP 122/72 | HR 71 | Temp 97.1°F | Ht 61.5 in | Wt 144.5 lb

## 2019-02-13 DIAGNOSIS — N393 Stress incontinence (female) (male): Secondary | ICD-10-CM

## 2019-02-13 DIAGNOSIS — Z0000 Encounter for general adult medical examination without abnormal findings: Secondary | ICD-10-CM

## 2019-02-13 DIAGNOSIS — E785 Hyperlipidemia, unspecified: Secondary | ICD-10-CM | POA: Diagnosis not present

## 2019-02-13 NOTE — Patient Instructions (Addendum)
Start calcium and vitamin D. You need 1200 mg of calcium and 800 units of vitamin D daily.   Start exercising. You should be getting 150 minutes of moderate intensity exercise weekly.  It's important to improve your diet by reducing consumption of fast food, fried food, processed snack foods, sugary drinks. Increase consumption of fresh vegetables and fruits, whole grains, water.  Ensure you are drinking 64 ounces of water daily.  It was a pleasure to see you today!

## 2019-02-13 NOTE — Progress Notes (Signed)
Subjective:    Patient ID: Savannah Macias, female    DOB: [birth year], 68 y.o.   MRN: [id number]  HPI  Savannah Macias is a 68 year old female who presents today for [place] Part 2.  BP Readings from Last 3 Encounters:  02/13/19 122/72  11/30/17 122/80  11/23/17 130/80   The 10-year ASCVD risk score Savannah Macias DC Jr., et al., 2013) is: 6.9%   Values used to calculate the score:     Age: 17 years     Sex: Female     Is Non-Hispanic African American: No     Diabetic: No     Tobacco smoker: No     Systolic Blood Pressure: 123XX123 mmHg     Is BP treated: No     HDL Cholesterol: 82.9 mg/dL     Total Cholesterol: 257 mg/dL  Wt Readings from Last 3 Encounters:  02/13/19 144 lb 8 oz (65.5 kg)  02/06/19 142 lb (64.4 kg)  11/30/17 141 lb 8 oz (64.2 kg)     Immunizations: -Tetanus: Completed in 2017 -Influenza: Declines -Shingles: Declines -Pneumonia: Completed in 2017 and 2018  Mammogram: Completed in 2019, declines this year, wants to defer until 2021 Dexa: Completed in 2019, osteoporosis. Was on Fosamax, last dose in 2010. She does not take calcium and vitamin D.  Colonoscopy: Completed in 2019, negative Hep C Screen: Negative   Review of Systems  Constitutional: Negative for unexpected weight change.  HENT: Negative for rhinorrhea.   Respiratory: Negative for cough and shortness of breath.   Cardiovascular: Negative for chest pain.  Gastrointestinal: Negative for constipation and diarrhea.  Genitourinary: Negative for difficulty urinating.  Musculoskeletal: Negative for arthralgias and myalgias.  Skin: Negative for rash.  Allergic/Immunologic: Negative for environmental allergies.  Neurological: Negative for dizziness, numbness and headaches.  Psychiatric/Behavioral:       Some anxiety with Covid-19       Past Medical History:  Diagnosis Date  . Allergy   . Chicken pox   . History of recurrent UTI (urinary tract infection)   . Stress incontinence      Social History    Socioeconomic History  . Marital status: Married    Spouse name: Not on file  . Number of children: Not on file  . Years of education: Not on file  . Highest education level: Not on file  Occupational History  . Not on file  Social Needs  . Financial resource strain: Not hard at all  . Food insecurity    Worry: Never true    Inability: Never true  . Transportation needs    Medical: No    Non-medical: No  Tobacco Use  . Smoking status: Never Smoker  . Smokeless tobacco: Never Used  Substance and Sexual Activity  . Alcohol use: Yes    Alcohol/week: 5.0 standard drinks    Types: 4 Glasses of wine, 1 Cans of beer per week    Comment: social  . Drug use: No  . Sexual activity: Yes    Partners: Male  Lifestyle  . Physical activity    Days per week: 0 days    Minutes per session: 0 min  . Stress: Not at all  Relationships  . Social Herbalist on phone: Not on file    Gets together: Not on file    Attends religious service: Not on file    Active member of club or organization: Not on file    Attends meetings of  clubs or organizations: Not on file    Relationship status: Not on file  . Intimate partner violence    Fear of current or ex partner: No    Emotionally abused: No    Physically abused: No    Forced sexual activity: No  Other Topics Concern  . Not on file  Social History Narrative   Married.   Moved to [place] from [place], [place].   Retired Pharmacist, hospital. Taught kindergarten and second grade.   Enjoys working in the yard, reading, crocheting, sewing, cooking.    Past Surgical History:  Procedure Laterality Date  . BREAST EXCISIONAL BIOPSY Left    benign  . TONSILLECTOMY AND ADENOIDECTOMY  1972  . TUBAL LIGATION  1983    Family History  Problem Relation Age of Onset  . Lung cancer Mother   . Hyperlipidemia Father   . Hypertension Father   . Breast cancer Sister        15  . Hypertension Brother   . Hypertension Paternal Grandmother   .  Hypertension Paternal Grandfather     No Known Allergies  Current Outpatient Medications on File Prior to Visit  Medication Sig Dispense Refill  . Multiple Vitamins-Minerals (PRESERVISION AREDS 2 PO) Take 1 capsule by mouth daily.     No current facility-administered medications on file prior to visit.     BP 122/72   Pulse 71   Temp (!) 97.1 F (36.2 C) (Temporal)   Ht 5' 1.5" (1.562 m)   Wt 144 lb 8 oz (65.5 kg)   SpO2 98%   BMI 26.86 kg/m    Objective:   Physical Exam  Constitutional: She is oriented to person, place, and time. She appears well-nourished.  HENT:  Right Ear: Tympanic membrane and ear canal normal.  Left Ear: Tympanic membrane and ear canal normal.  Mouth/Throat: Oropharynx is clear and moist.  Eyes: Pupils are equal, round, and reactive to light. EOM are normal.  Neck: Neck supple.  Cardiovascular: Normal rate and regular rhythm.  Respiratory: Effort normal and breath sounds normal.  GI: Soft. Bowel sounds are normal. There is no abdominal tenderness.  Musculoskeletal: Normal range of motion.  Neurological: She is alert and oriented to person, place, and time. No cranial nerve deficit.  Reflex Scores:      Patellar reflexes are 2+ on the right side and 2+ on the left side. Skin: Skin is warm and dry.  Psychiatric: She has a normal mood and affect.           Assessment & Plan:

## 2019-02-13 NOTE — Assessment & Plan Note (Signed)
Increase in LDL to 160 on recent labs which is an increase from 2019. She admits to a poor diet and lack of exercise.  ASCVD risk score of 6.9%, discussed her risk of heart disease/stroke within 10 years, she kindly declines treatment despite recommendations. She will work on lifestyle changes.   Continue to monitor.

## 2019-02-13 NOTE — Assessment & Plan Note (Addendum)
Declines influenza and Shingles vaccinations. Mammogram due, she would like to defer until 2021. Bone density scan UTD, due in 2021. Colon cancer screening UTD, completed cologuard but cannot find results in chart. Exam today unremarkable. Labs reviewed.

## 2019-02-13 NOTE — Assessment & Plan Note (Signed)
Intermittent, overall able to manage with liners.  Tolerable.

## 2019-09-23 DIAGNOSIS — Z20828 Contact with and (suspected) exposure to other viral communicable diseases: Secondary | ICD-10-CM | POA: Diagnosis not present

## 2019-12-04 DIAGNOSIS — Z20828 Contact with and (suspected) exposure to other viral communicable diseases: Secondary | ICD-10-CM | POA: Diagnosis not present

## 2020-06-05 DIAGNOSIS — Z20828 Contact with and (suspected) exposure to other viral communicable diseases: Secondary | ICD-10-CM | POA: Diagnosis not present

## 2021-02-16 ENCOUNTER — Encounter: Payer: Medicare Other | Admitting: Primary Care

## 2021-03-17 ENCOUNTER — Encounter: Payer: Medicare Other | Admitting: Primary Care

## 2021-04-01 DIAGNOSIS — D3132 Benign neoplasm of left choroid: Secondary | ICD-10-CM | POA: Diagnosis not present

## 2021-05-07 ENCOUNTER — Encounter: Payer: Self-pay | Admitting: Primary Care

## 2021-05-19 ENCOUNTER — Encounter: Payer: Medicare Other | Admitting: Primary Care

## 2021-05-24 DIAGNOSIS — Z1211 Encounter for screening for malignant neoplasm of colon: Secondary | ICD-10-CM | POA: Diagnosis not present

## 2021-05-30 LAB — COLOGUARD: COLOGUARD: NEGATIVE

## 2021-07-22 ENCOUNTER — Encounter: Payer: Medicare Other | Admitting: Primary Care

## 2023-01-04 ENCOUNTER — Telehealth: Payer: Self-pay | Admitting: Primary Care

## 2023-01-04 NOTE — Telephone Encounter (Signed)
Patient called in and wanted to know if she could re-establish care with [facility]. Please advise. Thank you!

## 2023-01-04 NOTE — Telephone Encounter (Signed)
Yes, of course! Add her anywhere.

## 2023-01-04 NOTE — Telephone Encounter (Signed)
Patient has been scheduled

## 2023-02-08 ENCOUNTER — Ambulatory Visit: Payer: Medicare Other | Admitting: Primary Care

## 2023-02-08 ENCOUNTER — Encounter: Payer: Self-pay | Admitting: Primary Care

## 2023-02-08 VITALS — BP 152/78 | HR 85 | Temp 97.5°F | Ht 61.25 in | Wt 143.0 lb

## 2023-02-08 DIAGNOSIS — Z131 Encounter for screening for diabetes mellitus: Secondary | ICD-10-CM

## 2023-02-08 DIAGNOSIS — Z1231 Encounter for screening mammogram for malignant neoplasm of breast: Secondary | ICD-10-CM

## 2023-02-08 DIAGNOSIS — R030 Elevated blood-pressure reading, without diagnosis of hypertension: Secondary | ICD-10-CM

## 2023-02-08 DIAGNOSIS — N393 Stress incontinence (female) (male): Secondary | ICD-10-CM

## 2023-02-08 DIAGNOSIS — E785 Hyperlipidemia, unspecified: Secondary | ICD-10-CM

## 2023-02-08 DIAGNOSIS — E2839 Other primary ovarian failure: Secondary | ICD-10-CM

## 2023-02-08 DIAGNOSIS — I10 Essential (primary) hypertension: Secondary | ICD-10-CM | POA: Insufficient documentation

## 2023-02-08 LAB — COMPREHENSIVE METABOLIC PANEL
ALT: 12 U/L (ref 0–35)
AST: 17 U/L (ref 0–37)
Albumin: 4.6 g/dL (ref 3.5–5.2)
Alkaline Phosphatase: 60 U/L (ref 39–117)
BUN: 17 mg/dL (ref 6–23)
CO2: 30 meq/L (ref 19–32)
Calcium: 9.7 mg/dL (ref 8.4–10.5)
Chloride: 104 meq/L (ref 96–112)
Creatinine, Ser: 0.83 mg/dL (ref 0.40–1.20)
GFR: 70.16 mL/min (ref >60.00)
Glucose, Bld: 95 mg/dL (ref 70–99)
Potassium: 4.1 meq/L (ref 3.5–5.1)
Sodium: 140 meq/L (ref 135–145)
Total Bilirubin: 0.5 mg/dL (ref 0.2–1.2)
Total Protein: 7.4 g/dL (ref 6.0–8.3)

## 2023-02-08 LAB — LIPID PANEL
Cholesterol: 266 mg/dL — ABNORMAL HIGH (ref 0–200)
HDL: 79 mg/dL (ref >39.00)
LDL Cholesterol: 173 mg/dL — ABNORMAL HIGH (ref 0–99)
NonHDL: 186.96
Total CHOL/HDL Ratio: 3
Triglycerides: 72 mg/dL (ref 0.0–149.0)
VLDL: 14.4 mg/dL (ref 0.0–40.0)

## 2023-02-08 LAB — CBC
HCT: 41 % (ref 36.0–46.0)
Hemoglobin: 13.6 g/dL (ref 12.0–15.0)
MCHC: 33.2 g/dL (ref 30.0–36.0)
MCV: 91.7 fL (ref 78.0–100.0)
Platelets: 262 10*3/uL (ref 150.0–400.0)
RBC: 4.48 Mil/uL (ref 3.87–5.11)
RDW: 13.5 % (ref 11.5–15.5)
WBC: 4.9 10*3/uL (ref 4.0–10.5)

## 2023-02-08 LAB — HEMOGLOBIN A1C: Hgb A1c MFr Bld: 5.8 % (ref 4.6–6.5)

## 2023-02-08 NOTE — Progress Notes (Signed)
Subjective:    Patient ID: Savannah Macias, female    DOB: Feb 12, 1951, 72 y.o.   MRN: [id number]  HPI  [name] is a very pleasant 72 y.o. female with a history of stress incontinence, hyperlipidemia who presents today to reestablish care.  1) Hyperlipidemia: Not currently managed on treatment.  Last lipid panel was completed in December 2020 with LDL of 160, HDL of 82.9, triglycerides of 69.  She has a family history of hyperlipidemia in her father.  2) Elevated Blood Pressure Reading: No prior history of hypertension. She does have a family history of hypertension in her father. She denies chest pain, dizziness, headaches, blurred vision.   BP Readings from Last 3 Encounters:  02/08/23 (!) 152/78  02/13/19 122/72  11/30/17 122/80   She does not check her blood pressure at home, but she does have a BP cuff.   Immunizations: -Tetanus: Completed in 2017 -Influenza: Declines today -Shingles: Never completed  -Pneumonia: Completed Prevnar 13 in 2017, Pneumovax 23 in 2018. Declines prevnar 20 today   Mammogram: Completed last in 2019 Bone Density Scan: Completed last in 2019  Colonoscopy: Completed Cologuard in 2023, negative  Wt Readings from Last 3 Encounters:  02/08/23 143 lb (64.9 kg)  02/13/19 144 lb 8 oz (65.5 kg)  02/06/19 142 lb (64.4 kg)     Review of Systems  Eyes:  Negative for visual disturbance.  Respiratory:  Negative for shortness of breath.   Cardiovascular:  Negative for chest pain.  Gastrointestinal:  Negative for constipation and diarrhea.  Neurological:  Negative for headaches.         Past Medical History:  Diagnosis Date   Allergy    Chicken pox    History of recurrent UTI (urinary tract infection)    Stress incontinence     Social History   Socioeconomic History   Marital status: Married    Spouse name: Not on file   Number of children: Not on file   Years of education: Not on file   Highest education level: Not on file   Occupational History   Not on file  Tobacco Use   Smoking status: Never   Smokeless tobacco: Never  Vaping Use   Vaping status: Never Used  Substance and Sexual Activity   Alcohol use: Yes    Alcohol/week: 5.0 standard drinks of alcohol    Types: 4 Glasses of wine, 1 Cans of beer per week    Comment: social   Drug use: No   Sexual activity: Yes    Partners: Male  Other Topics Concern   Not on file  Social History Narrative   Married.   Moved to [place] from [place], [place].   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   Enjoys working in the yard, reading, crocheting, sewing, cooking.   Social Determinants of Health   Financial Resource Strain: Low Risk  (02/06/2019)   Overall Financial Resource Strain (CARDIA)    Difficulty of Paying Living Expenses: Not hard at all  Food Insecurity: No Food Insecurity (02/06/2019)   Hunger Vital Sign    Worried About Running Out of Food in the Last Year: Never true    Ran Out of Food in the Last Year: Never true  Transportation Needs: No Transportation Needs (02/06/2019)   PRAPARE - Administrator, Civil Service (Medical): No    Lack of Transportation (Non-Medical): No  Physical Activity: Inactive (02/06/2019)   Exercise Vital Sign    Days of Exercise  per Week: 0 days    Minutes of Exercise per Session: 0 min  Stress: No Stress Concern Present (02/06/2019)   [employer] of Occupational Health - Occupational Stress Questionnaire    Feeling of Stress : Not at all  Social Connections: Not on file  Intimate Partner Violence: Not At Risk (02/06/2019)   Humiliation, Afraid, Rape, and Kick questionnaire    Fear of Current or Ex-Partner: No    Emotionally Abused: No    Physically Abused: No    Sexually Abused: No    Past Surgical History:  Procedure Laterality Date   BREAST EXCISIONAL BIOPSY Left    benign   TONSILLECTOMY AND ADENOIDECTOMY  1972   TUBAL LIGATION  1983    Family History  Problem Relation  Age of Onset   Lung cancer Mother    Hyperlipidemia Father    Hypertension Father    Breast cancer Sister        25   Hypertension Brother    Hypertension Paternal Grandmother    Hypertension Paternal Grandfather     No Known Allergies  Current Outpatient Medications on File Prior to Visit  Medication Sig Dispense Refill   calcium-vitamin D (OSCAL WITH D) 500-5 MG-MCG tablet Take 1 tablet by mouth daily with breakfast.     Multiple Vitamins-Minerals (PRESERVISION AREDS 2 PO) Take 1 capsule by mouth daily.     No current facility-administered medications on file prior to visit.    BP (!) 152/78   Pulse 85   Temp (!) 97.5 F (36.4 C) (Temporal)   Ht 5' 1.25" (1.556 m)   Wt 143 lb (64.9 kg)   SpO2 98%   BMI 26.80 kg/m  Objective:   Physical Exam Cardiovascular:     Rate and Rhythm: Normal rate and regular rhythm.  Pulmonary:     Effort: Pulmonary effort is normal.     Breath sounds: Normal breath sounds.  Musculoskeletal:     Cervical back: Neck supple.  Skin:    General: Skin is warm and dry.  Neurological:     Mental Status: She is alert and oriented to person, place, and time.  Psychiatric:        Mood and Affect: Mood normal.           Assessment & Plan:  Hyperlipidemia, unspecified hyperlipidemia type Assessment & Plan: Repeat lipid panel pending.  Discussed the importance of a healthy diet and regular exercise in order for weight loss, and to reduce the risk of further co-morbidity.   Orders: -     Lipid panel -     Comprehensive metabolic panel -     CBC  Screening mammogram for breast cancer -     3D Screening Mammogram, Left and Right; Future  Estrogen deficiency -     DG Bone Density; Future  Stress incontinence Assessment & Plan: Stable.  No concerns today. Continue to monitor.   Elevated blood pressure reading in office without diagnosis of hypertension Assessment & Plan: Above goal today, little improvement on recheck.  I've  asked that she start checking BP at home and to report if blood pressure readings are consistently at or above 140/90.   Screening for diabetes mellitus -     Hemoglobin A1c        Doreene Nest, NP

## 2023-02-08 NOTE — Assessment & Plan Note (Addendum)
Above goal today, little improvement on recheck.  I've asked that she start checking BP at home and to report if blood pressure readings are consistently at or above 140/90.

## 2023-02-08 NOTE — Assessment & Plan Note (Signed)
Stable.  No concerns today. Continue to monitor.  

## 2023-02-08 NOTE — Assessment & Plan Note (Signed)
Repeat lipid panel pending.  Discussed the importance of a healthy diet and regular exercise in order for weight loss, and to reduce the risk of further co-morbidity.  

## 2023-02-08 NOTE — Patient Instructions (Signed)
Stop by the lab prior to leaving today. I will notify you of your results once received.   Start monitoring your blood pressure daily, around the same time of day, for the next 2-3 weeks.  Ensure that you have rested for 30 minutes prior to checking your blood pressure.   Record your readings and notify me if you see numbers consistently at or above 140 on top and/or 90 on bottom.  [facility] to schedule your mammogram and bone density scan.   It was a pleasure to see you today!

## 2023-03-31 ENCOUNTER — Telehealth: Payer: Self-pay | Admitting: Primary Care

## 2023-03-31 NOTE — Telephone Encounter (Signed)
Patient came back by the office and is upset because her original appointment was scheduled for 10:10 today and was changed without her knowledge. She just wanted for future appointments if she could be made aware by telephone call when appointments are changed. Thank you!

## 2023-03-31 NOTE — Telephone Encounter (Signed)
Patient is calling in regards to her appt that was rescheduled she stated she was not late and she is upset about this cause she was inside the office and is wondering why she was rescheduled

## 2023-03-31 NOTE — Telephone Encounter (Signed)
Visit on 03/31/23 was with Wellness nurse.

## 2023-04-03 ENCOUNTER — Ambulatory Visit (INDEPENDENT_AMBULATORY_CARE_PROVIDER_SITE_OTHER): Payer: Medicare Other

## 2023-04-03 VITALS — Ht 61.25 in | Wt 140.0 lb

## 2023-04-03 DIAGNOSIS — Z0000 Encounter for general adult medical examination without abnormal findings: Secondary | ICD-10-CM | POA: Diagnosis not present

## 2023-04-03 NOTE — Patient Instructions (Addendum)
Savannah Macias , Thank you for taking time to come for your Medicare Wellness Visit. I appreciate your ongoing commitment to your health goals. Please review the following plan we discussed and let me know if I can assist you in the future.   Referrals/Orders/Follow-Ups/Clinician Recommendations: none  This is a list of the screening recommended for you and due dates:  Health Maintenance  Topic Date Due   Mammogram  11/18/2019   COVID-19 Vaccine (1 - 2024-25 season) Never done   Zoster (Shingles) Vaccine (1 of 2) 05/09/2023*   Flu Shot  06/05/2023*   Medicare Annual Wellness Visit  04/02/2024   Cologuard (Stool DNA test)  05/24/2024   DTaP/Tdap/Td vaccine (2 - Tdap) 03/29/2025   Pneumonia Vaccine  Completed   DEXA scan (bone density measurement)  Completed   Hepatitis C Screening  Completed   HPV Vaccine  Aged Out   Colon Cancer Screening  Discontinued  *Topic was postponed. The date shown is not the original due date.    Advanced directives: (In Chart) A copy of your advanced directives are scanned into your chart should your provider ever need it.  Next Medicare Annual Wellness Visit scheduled for next year: Yes 04/03/2024 @ 1:40pm televisit

## 2023-04-03 NOTE — Progress Notes (Signed)
Subjective:   Savannah Macias is a 73 y.o. female who presents for Medicare Annual (Subsequent) preventive examination.  Visit Complete: Virtual I connected with  [name] on 04/03/23 by a audio enabled telemedicine application and verified that I am speaking with the correct person using two identifiers.  Patient Location: Home  Provider Location: Home Office  I discussed the limitations of evaluation and management by telemedicine. The patient expressed understanding and agreed to proceed.  Vital Signs: Because this visit was a virtual/telehealth visit, some criteria may be missing or patient reported. Any vitals not documented were not able to be obtained and vitals that have been documented are patient reported.  Patient Medicare AWV questionnaire was completed by the patient on (not done); I have confirmed that all information answered by patient is correct and no changes since this date.  Cardiac Risk Factors include: advanced age (>92men, >29 women);dyslipidemia    Objective:    Today's Vitals   04/03/23 1345  Weight: 140 lb (63.5 kg)  Height: 5' 1.25" (1.556 m)  PainSc: 0-No pain   Body mass index is 26.24 kg/m.     04/03/2023    2:00 PM 02/06/2019    9:00 AM 11/23/2017   10:00 AM 11/15/2016   10:07 AM  Advanced Directives  Does Patient Have a Medical Advance Directive? Yes Yes Yes Yes  Type of Estate agent of [place];Living will Healthcare Power of [place];Living will Healthcare Power of [place];Living will Healthcare Power of [place];Living will  Does patient want to make changes to medical advance directive?   No - Patient declined   Copy of Healthcare Power of Attorney in Chart? Yes - validated most recent copy scanned in chart (See row information) Yes - validated most recent copy scanned in chart (See row information) No - copy requested No - copy requested    Current Medications (verified) Outpatient Encounter Medications as of 04/03/2023   Medication Sig   calcium-vitamin D (OSCAL WITH D) 500-5 MG-MCG tablet Take 1 tablet by mouth daily with breakfast.   Multiple Vitamins-Minerals (PRESERVISION AREDS 2 PO) Take 1 capsule by mouth daily.   No facility-administered encounter medications on file as of 04/03/2023.    Allergies (verified) Patient has no known allergies.   History: Past Medical History:  Diagnosis Date   Allergy    Chicken pox    History of recurrent UTI (urinary tract infection)    Stress incontinence    Past Surgical History:  Procedure Laterality Date   BREAST EXCISIONAL BIOPSY Left    benign   TONSILLECTOMY AND ADENOIDECTOMY  1972   TUBAL LIGATION  1983   Family History  Problem Relation Age of Onset   Lung cancer Mother    Hyperlipidemia Father    Hypertension Father    Breast cancer Sister        24   Hypertension Brother    Hypertension Paternal Grandmother    Hypertension Paternal Grandfather    Social History   Socioeconomic History   Marital status: Married    Spouse name: Not on file   Number of children: Not on file   Years of education: Not on file   Highest education level: Not on file  Occupational History   Not on file  Tobacco Use   Smoking status: Never   Smokeless tobacco: Never  Vaping Use   Vaping status: Never Used  Substance and Sexual Activity   Alcohol use: Yes    Alcohol/week: 5.0 standard drinks of  alcohol    Types: 4 Glasses of wine, 1 Cans of beer per week    Comment: social   Drug use: No   Sexual activity: Yes    Partners: Male  Other Topics Concern   Not on file  Social History Narrative   Married.   Moved to [place] from [place], [place].   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   Enjoys working in the yard, reading, crocheting, sewing, cooking.   Social Drivers of Corporate investment banker Strain: Low Risk  (04/03/2023)   Overall Financial Resource Strain (CARDIA)    Difficulty of Paying Living Expenses: Not hard at all   Food Insecurity: No Food Insecurity (04/03/2023)   Hunger Vital Sign    Worried About Running Out of Food in the Last Year: Never true    Ran Out of Food in the Last Year: Never true  Transportation Needs: No Transportation Needs (04/03/2023)   PRAPARE - Administrator, Civil Service (Medical): No    Lack of Transportation (Non-Medical): No  Physical Activity: Insufficiently Active (04/03/2023)   Exercise Vital Sign    Days of Exercise per Week: 4 days    Minutes of Exercise per Session: 30 min  Stress: No Stress Concern Present (04/03/2023)   [employer] of Occupational Health - Occupational Stress Questionnaire    Feeling of Stress : Not at all  Social Connections: Moderately Integrated (04/03/2023)   Social Connection and Isolation Panel [NHANES]    Frequency of Communication with Friends and Family: More than three times a week    Frequency of Social Gatherings with Friends and Family: More than three times a week    Attends Religious Services: Never    Database administrator or Organizations: Yes    Attends Engineer, structural: 1 to 4 times per year    Marital Status: Married    Tobacco Counseling Counseling given: Not Answered  Clinical Intake:  Pre-visit preparation completed: Yes  Pain : No/denies pain Pain Score: 0-No pain     BMI - recorded: 26.24 Nutritional Status: BMI 25 -29 Overweight Nutritional Risks: None Diabetes: No  How often do you need to have someone help you when you read instructions, pamphlets, or other written materials from your doctor or pharmacy?: 1 - Never  Interpreter Needed?: No  Comments: lives with husband Information entered by :: B.Floyd Wade,LPN   Activities of Daily Living    04/03/2023    2:00 PM  In your present state of health, do you have any difficulty performing the following activities:  Hearing? 0  Vision? 0  Difficulty concentrating or making decisions? 0  Walking or climbing stairs? 0   Dressing or bathing? 0  Doing errands, shopping? 0  Preparing Food and eating ? N  Using the Toilet? N  In the past six months, have you accidently leaked urine? Y  Do you have problems with loss of bowel control? N  Managing your Medications? N  Managing your Finances? N  Housekeeping or managing your Housekeeping? N    Patient Care Team: Doreene Nest, NP as PCP - General (Internal Medicine) Galen Manila, MD as Referring Physician (Ophthalmology)  Indicate any recent Medical Services you may have received from other than [facility] providers in the past year (date may be approximate).     Assessment:   This is a routine wellness examination for Akeema.  Hearing/Vision screen Hearing Screening - Comments:: Pt hearing is good per pt Vision  Screening - Comments:: Pt says vision is good w/glasses  Eye-[facility]   Goals Addressed             This Visit's Progress    Increase physical activity   On track    Starting 11/23/2017, I will continue to either walk 3 miles or to exercise on stationary bike for 12 miles 3 days per week.      Patient Stated   On track    02/06/2019, I will try to exercise more daily and work on losing some weight.        Depression Screen    04/03/2023    1:56 PM 02/08/2023    8:27 AM 02/06/2019    9:02 AM 11/23/2017    9:59 AM 11/15/2016   10:06 AM 08/23/2016   11:36 AM 03/30/2015    8:25 AM  PHQ 2/9 Scores  PHQ - 2 Score 0 0 0 0 0 0 0  PHQ- 9 Score   0 0 0      Fall Risk    04/03/2023    1:49 PM 02/08/2023    8:27 AM 02/06/2019    9:01 AM 01/25/2019    3:26 PM 11/23/2017    9:59 AM  Fall Risk   Falls in the past year? 0 0 0 0 No  Comment    Emmi Telephone Survey: data to providers prior to load   Number falls in past yr: 0 0 0    Injury with Fall? 0 0 0    Risk for fall due to : No Fall Risks No Fall Risks     Follow up Education provided;Falls prevention discussed Falls evaluation completed Falls evaluation completed;Falls  prevention discussed      MEDICARE RISK AT HOME: Medicare Risk at Home Any stairs in or around the home?: Yes If so, are there any without handrails?: Yes Home free of loose throw rugs in walkways, pet beds, electrical cords, etc?: Yes Adequate lighting in your home to reduce risk of falls?: Yes Life alert?: No Use of a cane, walker or w/c?: No Grab bars in the bathroom?: Yes Shower chair or bench in shower?: Yes Elevated toilet seat or a handicapped toilet?: Yes  TIMED UP AND GO:  Was the test performed?  No    Cognitive Function:    02/06/2019    9:04 AM 11/23/2017    9:59 AM 11/15/2016   10:08 AM  MMSE - Mini Mental State Exam  Orientation to time 5 5 5   Orientation to Place 5 5 5   Registration 3 3 3   Attention/ Calculation 5 0 0  Recall 3 3 3   Language- name 2 objects  0 0  Language- repeat 1 1 1   Language- follow 3 step command  3 3  Language- read & follow direction  0 0  Write a sentence  0 0  Copy design  0 0  Total score  20 20        04/03/2023    2:01 PM  6CIT Screen  What Year? 0 points  What month? 0 points  What time? 0 points  Count back from 20 0 points  Months in reverse 0 points  Repeat phrase 0 points  Total Score 0 points    Immunizations Immunization History  Administered Date(s) Administered   Pneumococcal Conjugate-13 03/30/2015   Pneumococcal Polysaccharide-23 11/15/2016   Td 03/30/2015    TDAP status: Up to date  Flu Vaccine status: Declined, Education has been provided regarding  the importance of this vaccine but patient still declined. Advised may receive this vaccine at local pharmacy or Health Dept. Aware to provide a copy of the vaccination record if obtained from local pharmacy or Health Dept. Verbalized acceptance and understanding.  Pneumococcal vaccine status: Up to date  Covid-19 vaccine status: Declined, Education has been provided regarding the importance of this vaccine but patient still declined. Advised may receive  this vaccine at local pharmacy or Health Dept.or vaccine clinic. Aware to provide a copy of the vaccination record if obtained from local pharmacy or Health Dept. Verbalized acceptance and understanding.  Qualifies for Shingles Vaccine? Yes   Zostavax completed No   Shingrix Completed?: No.    Education has been provided regarding the importance of this vaccine. Patient has been advised to call insurance company to determine out of pocket expense if they have not yet received this vaccine. Advised may also receive vaccine at local pharmacy or Health Dept. Verbalized acceptance and understanding.  Screening Tests Health Maintenance  Topic Date Due   MAMMOGRAM  11/18/2019   COVID-19 Vaccine (1 - 2024-25 season) Never done   Zoster Vaccines- Shingrix (1 of 2) 05/09/2023 (Originally 02/22/2000)   INFLUENZA VACCINE  06/05/2023 (Originally 10/06/2022)   Medicare Annual Wellness (AWV)  04/02/2024   Fecal DNA (Cologuard)  05/24/2024   DTaP/Tdap/Td (2 - Tdap) 03/29/2025   Pneumonia Vaccine 53+ Years old  Completed   DEXA SCAN  Completed   Hepatitis C Screening  Completed   HPV VACCINES  Aged Out   Colonoscopy  Discontinued    Health Maintenance  Health Maintenance Due  Topic Date Due   MAMMOGRAM  11/18/2019   COVID-19 Vaccine (1 - 2024-25 season) Never done    Colorectal cancer screening: Type of screening: Cologuard. Completed 05/24/2021. Repeat every 3 years  Mammogram status: Completed scheduled 04/05/2023. Repeat every year  Bone Density status: Completed no scheduled in [facility] 2025. Results reflect: Bone density results: NORMAL. Repeat every 5 years.  Lung Cancer Screening: (Low Dose CT Chest recommended if Age 49-80 years, 20 pack-year currently smoking OR have quit w/in 15years.) does not qualify.   Lung Cancer Screening Referral: no  Additional Screening:  Hepatitis C Screening: does not qualify; Completed 03/30/2015  Vision Screening: Recommended annual ophthalmology exams for  early detection of glaucoma and other disorders of the eye. Is the patient up to date with their annual eye exam?  Yes  Who is the provider or what is the name of the office in which the patient attends annual eye exams? Dr Druscilla Brownie If pt is not established with a provider, would they like to be referred to a provider to establish care? No .   Dental Screening: Recommended annual dental exams for proper oral hygiene  Diabetic Foot Exam: n/a  Community Resource Referral / Chronic Care Management: CRR required this visit?  No   CCM required this visit?  No     Plan:     I have personally reviewed and noted the following in the patient's chart:   Medical and social history Use of alcohol, tobacco or illicit drugs  Current medications and supplements including opioid prescriptions. Patient is not currently taking opioid prescriptions. Functional ability and status Nutritional status Physical activity Advanced directives List of other physicians Hospitalizations, surgeries, and ER visits in previous 12 months Vitals Screenings to include cognitive, depression, and falls Referrals and appointments  In addition, I have reviewed and discussed with patient certain preventive protocols, quality metrics, and best  practice recommendations. A written personalized care plan for preventive services as well as general preventive health recommendations were provided to patient.    Sue Lush, LPN   1/61/0960   After Visit Summary: (MyChart) Due to this being a telephonic visit, the after visit summary with patients personalized plan was offered to patient via MyChart   Nurse Notes: The patient states she is doing well and has no concerns or questions at this time.

## 2023-04-05 ENCOUNTER — Ambulatory Visit
Admission: RE | Admit: 2023-04-05 | Discharge: 2023-04-05 | Disposition: A | Discharge status: Home / Self Care | Payer: Medicare Other | Source: Ambulatory Visit | Attending: Primary Care | Admitting: Primary Care

## 2023-04-05 DIAGNOSIS — Z1231 Encounter for screening mammogram for malignant neoplasm of breast: Secondary | ICD-10-CM | POA: Diagnosis not present

## 2023-04-06 DIAGNOSIS — H353190 Nonexudative age-related macular degeneration, unspecified eye, stage unspecified: Secondary | ICD-10-CM | POA: Diagnosis not present

## 2023-04-06 DIAGNOSIS — H11153 Pinguecula, bilateral: Secondary | ICD-10-CM | POA: Diagnosis not present

## 2023-04-06 DIAGNOSIS — H2512 Age-related nuclear cataract, left eye: Secondary | ICD-10-CM | POA: Diagnosis not present

## 2023-04-06 DIAGNOSIS — H43813 Vitreous degeneration, bilateral: Secondary | ICD-10-CM | POA: Diagnosis not present

## 2023-05-17 ENCOUNTER — Ambulatory Visit
Admission: RE | Admit: 2023-05-17 | Discharge: 2023-05-17 | Disposition: A | Discharge status: Home / Self Care | Payer: Medicare Other | Source: Ambulatory Visit | Attending: Primary Care | Admitting: Primary Care

## 2023-05-17 DIAGNOSIS — Z780 Asymptomatic menopausal state: Secondary | ICD-10-CM | POA: Diagnosis not present

## 2023-05-17 DIAGNOSIS — E2839 Other primary ovarian failure: Secondary | ICD-10-CM | POA: Insufficient documentation

## 2023-05-17 DIAGNOSIS — M810 Age-related osteoporosis without current pathological fracture: Secondary | ICD-10-CM | POA: Diagnosis not present

## 2023-06-14 ENCOUNTER — Encounter: Payer: Self-pay | Admitting: Primary Care

## 2023-06-14 ENCOUNTER — Ambulatory Visit (INDEPENDENT_AMBULATORY_CARE_PROVIDER_SITE_OTHER): Payer: Medicare Other | Admitting: Primary Care

## 2023-06-14 VITALS — BP 150/84 | HR 68 | Temp 97.1°F | Ht 61.25 in | Wt 126.0 lb

## 2023-06-14 DIAGNOSIS — R7303 Prediabetes: Secondary | ICD-10-CM

## 2023-06-14 DIAGNOSIS — M810 Age-related osteoporosis without current pathological fracture: Secondary | ICD-10-CM | POA: Diagnosis not present

## 2023-06-14 DIAGNOSIS — I10 Essential (primary) hypertension: Secondary | ICD-10-CM

## 2023-06-14 DIAGNOSIS — E785 Hyperlipidemia, unspecified: Secondary | ICD-10-CM

## 2023-06-14 LAB — LIPID PANEL
Cholesterol: 260 mg/dL — ABNORMAL HIGH (ref 0–200)
HDL: 70.1 mg/dL (ref >39.00)
LDL Cholesterol: 174 mg/dL — ABNORMAL HIGH (ref 0–99)
NonHDL: 190.3
Total CHOL/HDL Ratio: 4
Triglycerides: 84 mg/dL (ref 0.0–149.0)
VLDL: 16.8 mg/dL (ref 0.0–40.0)

## 2023-06-14 LAB — HEMOGLOBIN A1C: Hgb A1c MFr Bld: 5.7 % (ref 4.6–6.5)

## 2023-06-14 NOTE — Assessment & Plan Note (Signed)
 Significant deterioration since 2019.   Reviewed bone density scan from 2025 with patient in detail.  Discussed recommendations and different options for prescription treatment. Recommended Fosamax as she did well previously. She will think about this.  Continue calcium and vitamin D. Continue weight bearing exercise.   Await response once she decides upon treatment.  Repeat bone density scan in 1 year if possible.

## 2023-06-14 NOTE — Progress Notes (Signed)
 Subjective:    Patient ID: Savannah Macias, female    DOB: [birth year], 73 y.o.   MRN: [id number]  Hyperlipidemia Pertinent negatives include no chest pain or shortness of breath.    Savannah Macias is a very pleasant 73 y.o. female with a history of osteoporosis, hyperlipidemia, stress incontinence who presents today for cholesterol check and to review bone density scan results.  1) Osteoporosis: Progressing. She completed a bone density scan on 05/17/2023 which revealed a T-score of -3.5.  AP spine density decreased from -3.0 in 2019 to -3.5 in 2025.  Dual femur right neck density decreased from -1.8 in 2019 to -2.5 and 0.25.  Left forearm radius density decreased from -2.4 in 2019 to -3.4 in 2025.  Previously managed on Fosamax from 2003 to 2009. She had no adverse effects.  She is compliant to calcium and vitamin D supplements daily.  She is also active daily, exercises several days weekly.   2) Hyperlipidemia: Not currently on treatment.  Lipid panel from December 2024 with LDL of 173, HDL of 79.  She has a family history of hyperlipidemia and hypertension in her father.  Since her last visit she's lost nearly 20 pounds on purpose since December 2024. She is following a low sugar and low carb diet. She's increased her consumption of veggies, fruit, protein.   Wt Readings from Last 3 Encounters:  06/14/23 126 lb (57.2 kg)  04/03/23 140 lb (63.5 kg)  02/08/23 143 lb (64.9 kg)    3) Hypertension: Not currently on treatment. BP readings during the last two office visits have been above goal.  Significant change from blood pressure reading from December 2020. She's improved her diet and has lost nearly 20 pounds. She is checking her BP at home which is running 120-130/70-80s.    BP Readings from Last 3 Encounters:  06/14/23 (!) 150/84  02/08/23 (!) 152/78  02/13/19 122/72     Review of Systems  Respiratory:  Negative for shortness of breath.   Cardiovascular:  Negative for chest pain.   Neurological:  Negative for dizziness and headaches.         Past Medical History:  Diagnosis Date   Allergy    Chicken pox    History of recurrent UTI (urinary tract infection)    Stress incontinence     Social History   Socioeconomic History   Marital status: Married    Spouse name: Not on file   Number of children: Not on file   Years of education: Not on file   Highest education level: Not on file  Occupational History   Not on file  Tobacco Use   Smoking status: Never   Smokeless tobacco: Never  Vaping Use   Vaping status: Never Used  Substance and Sexual Activity   Alcohol use: Yes    Alcohol/week: 5.0 standard drinks of alcohol    Types: 4 Glasses of wine, 1 Cans of beer per week    Comment: social   Drug use: No   Sexual activity: Yes    Partners: Male  Other Topics Concern   Not on file  Social History Narrative   Married.   Moved to [place] from [place], [place].   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   Enjoys working in the yard, reading, crocheting, sewing, cooking.   Social Drivers of Health   Financial Resource Strain: Low Risk  (04/03/2023)   Overall Financial Resource Strain (CARDIA)    Difficulty of Paying Living Expenses: Not  hard at all  Food Insecurity: No Food Insecurity (04/03/2023)   Hunger Vital Sign    Worried About Running Out of Food in the Last Year: Never true    Ran Out of Food in the Last Year: Never true  Transportation Needs: No Transportation Needs (04/03/2023)   PRAPARE - Administrator, Civil Service (Medical): No    Lack of Transportation (Non-Medical): No  Physical Activity: Insufficiently Active (04/03/2023)   Exercise Vital Sign    Days of Exercise per Week: 4 days    Minutes of Exercise per Session: 30 min  Stress: No Stress Concern Present (04/03/2023)   [employer] of Occupational Health - Occupational Stress Questionnaire    Feeling of Stress : Not at all  Social Connections:  Moderately Integrated (04/03/2023)   Social Connection and Isolation Panel [NHANES]    Frequency of Communication with Friends and Family: More than three times a week    Frequency of Social Gatherings with Friends and Family: More than three times a week    Attends Religious Services: Never    Database administrator or Organizations: Yes    Attends Banker Meetings: 1 to 4 times per year    Marital Status: Married  Catering manager Violence: Not At Risk (04/03/2023)   Humiliation, Afraid, Rape, and Kick questionnaire    Fear of Current or Ex-Partner: No    Emotionally Abused: No    Physically Abused: No    Sexually Abused: No    Past Surgical History:  Procedure Laterality Date   BREAST EXCISIONAL BIOPSY Left    benign   TONSILLECTOMY AND ADENOIDECTOMY  1972   TUBAL LIGATION  1983    Family History  Problem Relation Age of Onset   Lung cancer Mother    Hyperlipidemia Father    Hypertension Father    Breast cancer Sister        63   Hypertension Brother    Hypertension Paternal Grandmother    Hypertension Paternal Grandfather     No Known Allergies  Current Outpatient Medications on File Prior to Visit  Medication Sig Dispense Refill   calcium-vitamin D (OSCAL WITH D) 500-5 MG-MCG tablet Take 1 tablet by mouth daily with breakfast.     Multiple Vitamins-Minerals (PRESERVISION AREDS 2 PO) Take 1 capsule by mouth daily.     No current facility-administered medications on file prior to visit.    BP (!) 150/84   Pulse 68   Temp (!) 97.1 F (36.2 C) (Temporal)   Ht 5' 1.25" (1.556 m)   Wt 126 lb (57.2 kg)   SpO2 98%   BMI 23.61 kg/m  Objective:   Physical Exam Cardiovascular:     Rate and Rhythm: Normal rate and regular rhythm.  Pulmonary:     Effort: Pulmonary effort is normal.     Breath sounds: Normal breath sounds.  Musculoskeletal:     Cervical back: Neck supple.  Skin:    General: Skin is warm and dry.  Neurological:     Mental Status:  She is alert and oriented to person, place, and time.  Psychiatric:        Mood and Affect: Mood normal.           Assessment & Plan:  Hyperlipidemia, unspecified hyperlipidemia type Assessment & Plan: Repeat lipid panel pending.  Commended her on weight loss and a healthier diet.  Consider coronary CT scan if lipid panel remains high.   Orders: -  Lipid panel  Age-related osteoporosis without current pathological fracture Assessment & Plan: Significant deterioration since 2019.   Reviewed bone density scan from 2025 with patient in detail.  Discussed recommendations and different options for prescription treatment. Recommended Fosamax as she did well previously. She will think about this.  Continue calcium and vitamin D. Continue weight bearing exercise.   Await response once she decides upon treatment.  Repeat bone density scan in 1 year if possible.   Prediabetes Assessment & Plan: Repeat A1C pending.  Commended her on regular exercise and improvements in diet!  Orders: -     Hemoglobin A1c  Primary hypertension Assessment & Plan: Improved but still above goal today. Home readings are controlled.  Continue to monitor home readings.         Doreene Nest, NP

## 2023-06-14 NOTE — Assessment & Plan Note (Signed)
 Repeat A1C pending.  Commended her on regular exercise and improvements in diet!

## 2023-06-14 NOTE — Assessment & Plan Note (Signed)
 Improved but still above goal today. Home readings are controlled.  Continue to monitor home readings.

## 2023-06-14 NOTE — Assessment & Plan Note (Signed)
 Repeat lipid panel pending.  Commended her on weight loss and a healthier diet.  Consider coronary CT scan if lipid panel remains high.

## 2024-04-03 ENCOUNTER — Ambulatory Visit: Payer: Medicare Other

## 2024-04-24 ENCOUNTER — Encounter: Admitting: Primary Care

## 2024-05-08 ENCOUNTER — Encounter: Admitting: Primary Care

## 2024-10-28 ENCOUNTER — Ambulatory Visit
